# Patient Record
Sex: Female | Born: 1960 | Race: White | Hispanic: No | Marital: Married | Smoking: Never smoker
Health system: Southern US, Community
[De-identification: ages and names within clinical notes are randomized; demographics above are authoritative.]

## PROBLEM LIST (undated history)

## (undated) DIAGNOSIS — N390 Urinary tract infection, site not specified: Secondary | ICD-10-CM

## (undated) DIAGNOSIS — D649 Anemia, unspecified: Secondary | ICD-10-CM

## (undated) DIAGNOSIS — E785 Hyperlipidemia, unspecified: Secondary | ICD-10-CM

## (undated) DIAGNOSIS — M810 Age-related osteoporosis without current pathological fracture: Secondary | ICD-10-CM

## (undated) DIAGNOSIS — M1990 Unspecified osteoarthritis, unspecified site: Secondary | ICD-10-CM

## (undated) DIAGNOSIS — E7800 Pure hypercholesterolemia, unspecified: Secondary | ICD-10-CM

## (undated) DIAGNOSIS — N189 Chronic kidney disease, unspecified: Secondary | ICD-10-CM

## (undated) HISTORY — DX: Age-related osteoporosis without current pathological fracture: M81.0

## (undated) HISTORY — DX: Pure hypercholesterolemia, unspecified: E78.00

---

## 1996-06-22 HISTORY — PX: KNEE SURGERY: SHX244

## 2007-01-20 ENCOUNTER — Encounter: Admission: RE | Admit: 2007-01-20 | Discharge: 2007-01-20 | Payer: Self-pay | Admitting: *Deleted

## 2007-01-28 ENCOUNTER — Encounter: Admission: RE | Admit: 2007-01-28 | Discharge: 2007-01-28 | Payer: Self-pay | Admitting: *Deleted

## 2008-02-01 ENCOUNTER — Encounter: Admission: RE | Admit: 2008-02-01 | Discharge: 2008-02-01 | Payer: Self-pay | Admitting: *Deleted

## 2008-05-03 IMAGING — MG MM DIAGNOSTIC LTD BILATERAL
4 series · 4 of 4 positions shown · non-contrast
Comparison: Screening mammogram 01-20-07.

[REDACTED] BILATERAL
Bilateral CC and MLO view(s) were taken.

BILATERAL BREAST ULTRASOUND
DIGITAL LIMITED BILATERAL DIAGNOSTIC MAMMOGRAM AND BILATERAL BREAST ULTRASOUND:
CLINICAL DATA: Abnormal screening mammogram.

[R CC]
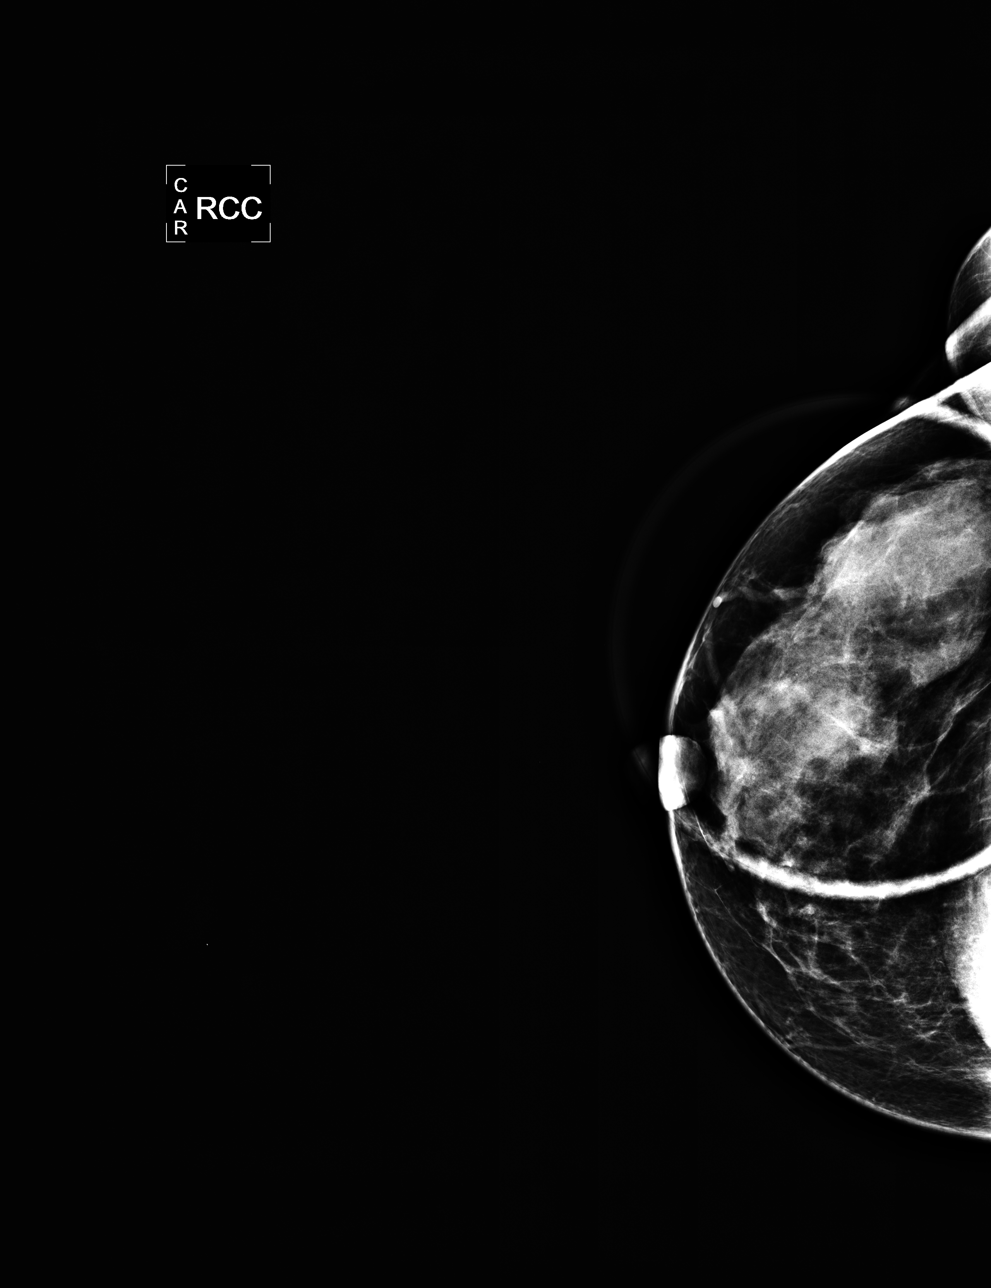

[R MLO]
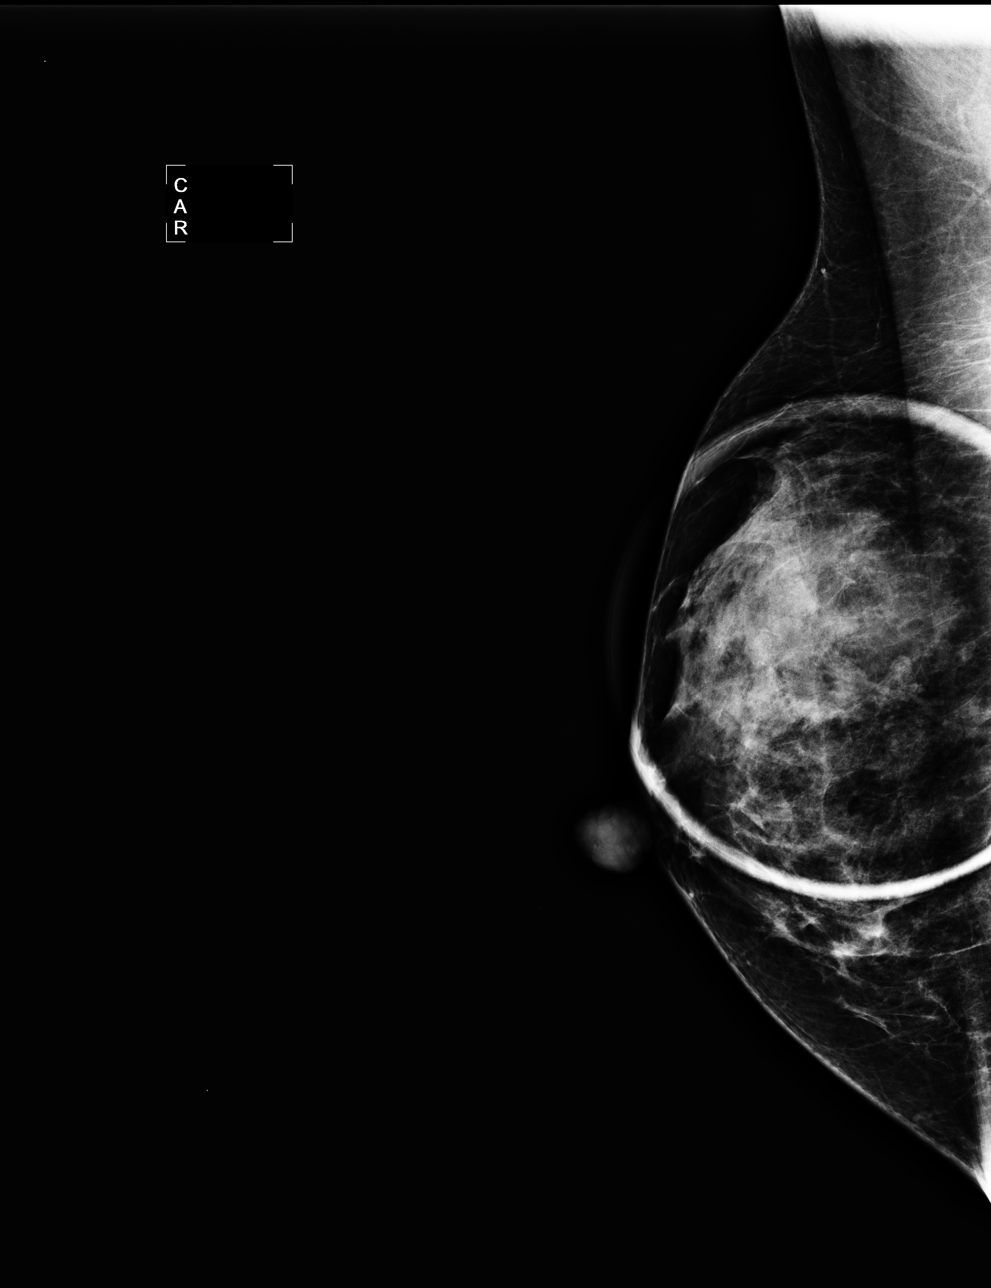

[L CC]
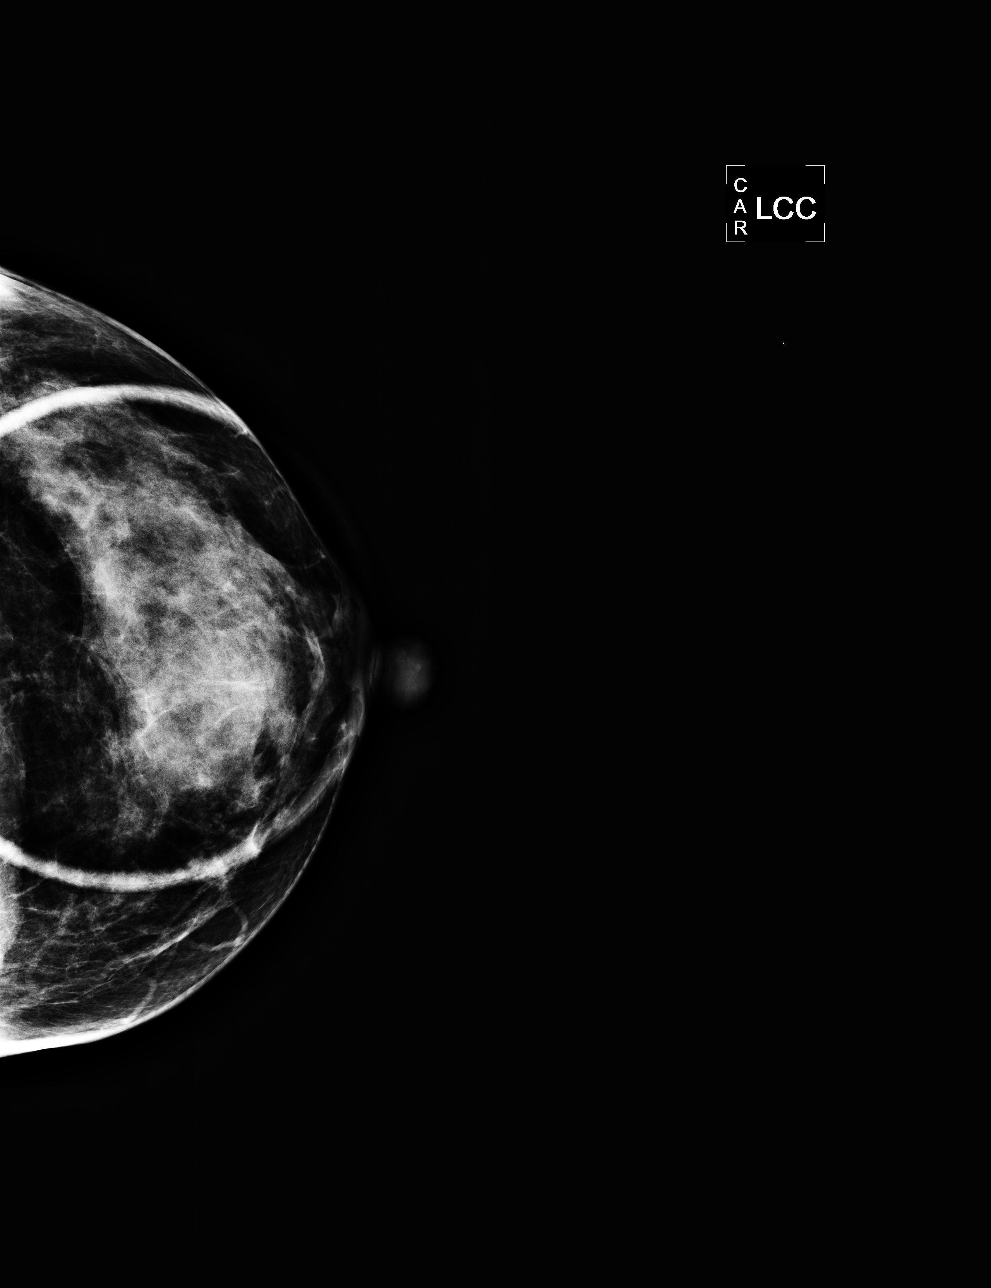

[L MLO]
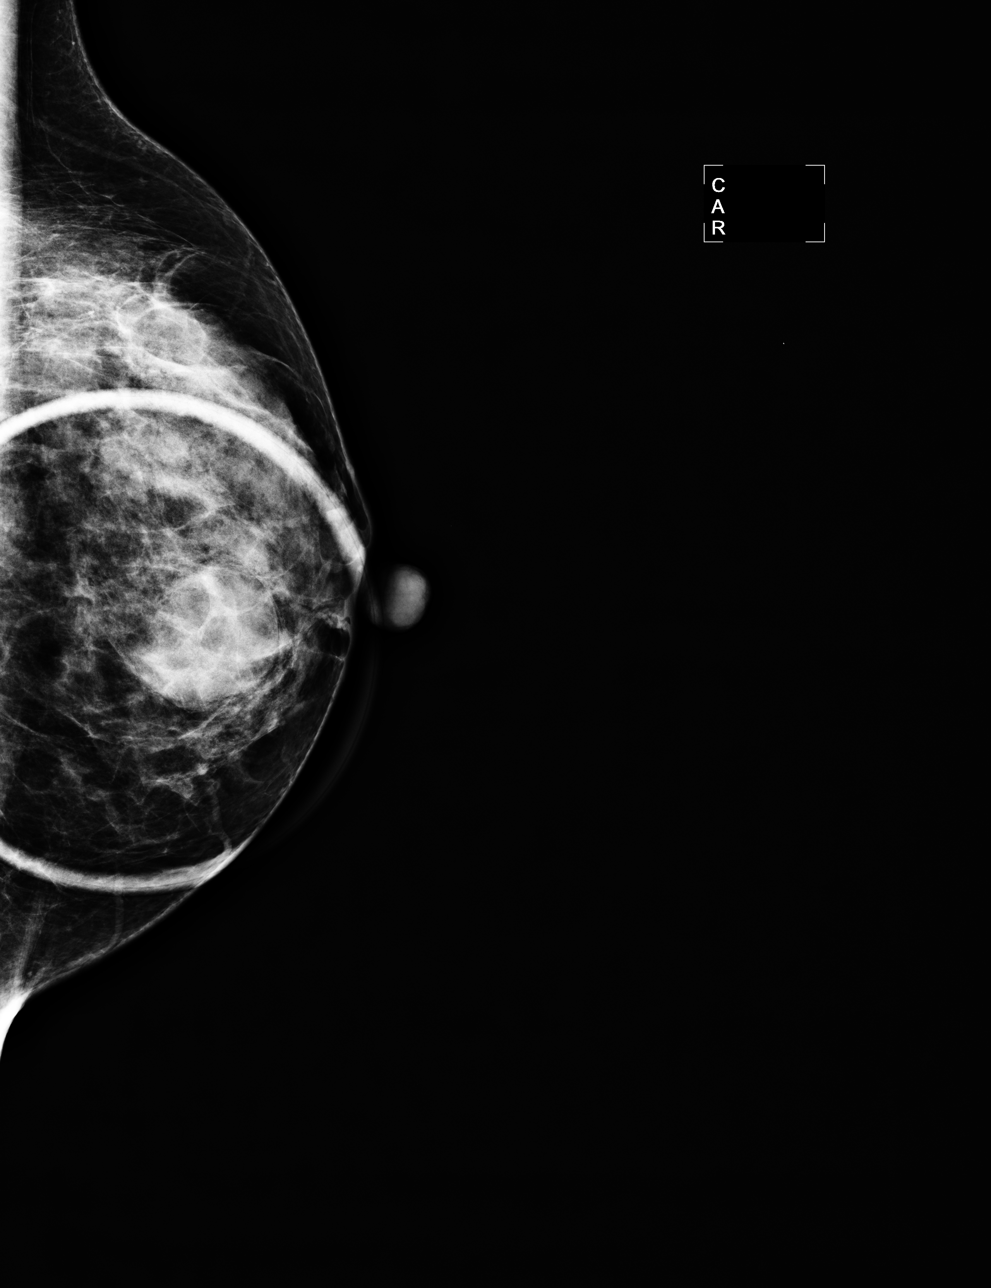

[4 of 4 positions shown; findings below may reference images not displayed]

No other priors for comparison.

Spot compression views of both breasts were performed to evaluate possible masses seen on recent 
screening mammogram.  It is noted that the patient's previous mammograms were not available for 
review and were performed in [place].

Spot compression views of the left breast confirm a round mass within dense fibroglandular breast 
tissue just slightly inferior to the nipple at the 6 o'clock position.

Spot compression views of the right breast show dense fibroglandular breast tissue without discrete
mass or architectural distortion.

Physical examination of the 6 o'clock region of the left breast is unremarkable.  Physical 
examination of the upper outer quadrant of the right breast is also unremarkable.  No dominant mass
can be palpated on physical exam.

Ultrasound of the left breast shows a simple cyst at 6 o'clock position subareolar location.  This 
measures 2.6 x 1.4 x 2.2 cm.  Additionally, in the surrounding tissue a few additional cysts are 
identified. No solid mass is seen.

Ultrasound of the upper outer quadrant of the right breast shows a large simple cyst at 10 o'clock 
2 cm from the nipple that measures 3.8 x 1.2 cm.  Immediately adjacent to the large cyst in the 
right breast at [DATE] o'clockis a 0.4 x 0.4  cm round hypoechoic nodule that most likely represents 
a cyst; however, it does contain some internal echoes and its small size makes characterization 
somewhat difficult.
IMPRESSION: Bilateral cysts as described above. There is one cyst in the right breast at [DATE] position that 
most likely represents a cyst although it is difficult to exclude a solid nodule and, therefore, it
is decided to proceed with ultrasound-guided cyst aspiration.  This will be dictated separately.

ASSESSMENT: Probably benign - BI-RADS 3

Aspiration of the right breast.
,

## 2008-05-03 IMAGING — US UNKNOWN US STUDY
1 series · 13 of 14 positions shown · non-contrast
Comparison: Screening mammogram 01-20-07.

[REDACTED] BILATERAL
Bilateral CC and MLO view(s) were taken.

BILATERAL BREAST ULTRASOUND
DIGITAL LIMITED BILATERAL DIAGNOSTIC MAMMOGRAM AND BILATERAL BREAST ULTRASOUND:
CLINICAL DATA: Abnormal screening mammogram.

[Series 1: unknown us study · 13 of 14 slices shown]
[im 1/14]
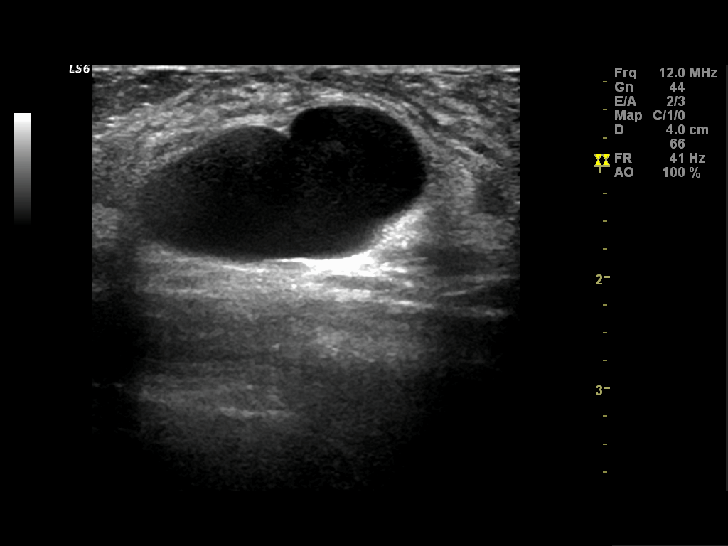
[im 2/14]
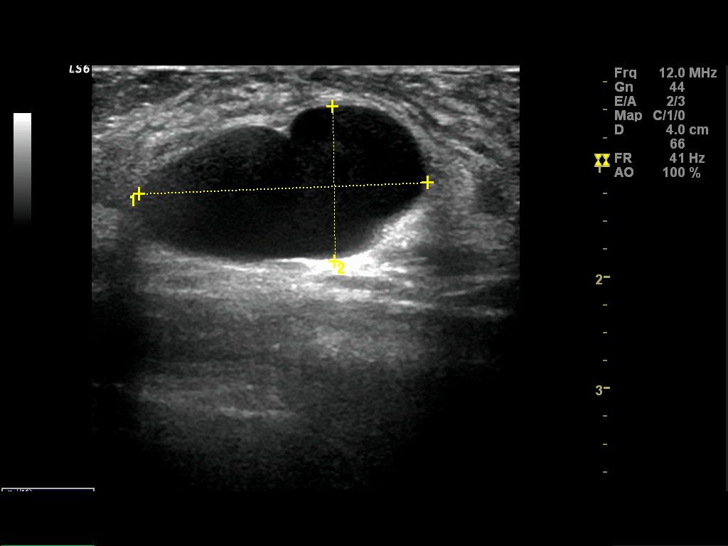
[im 3/14]
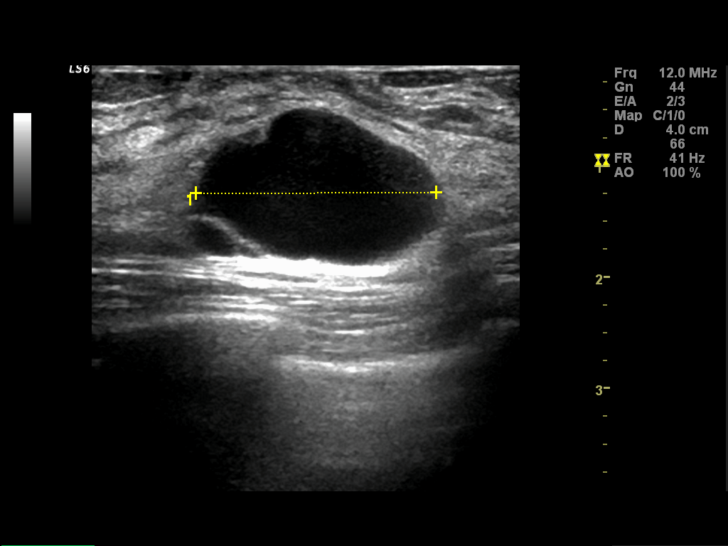
[im 4/14]
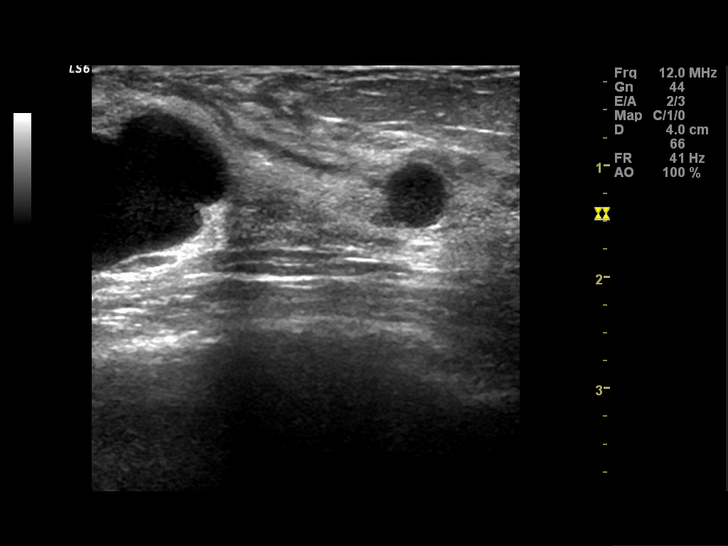
[im 5/14]
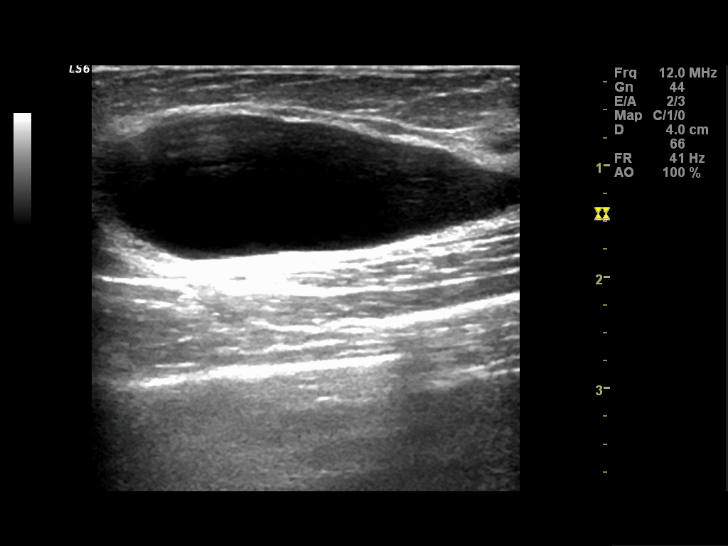
[im 6/14]
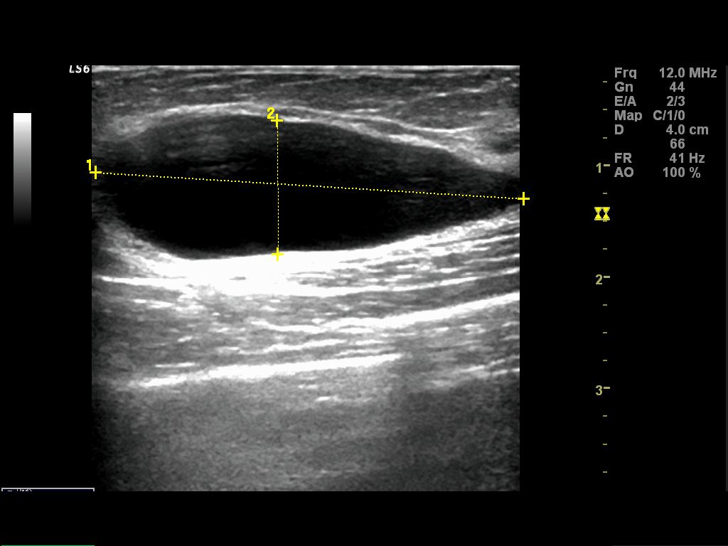
[im 8/14]
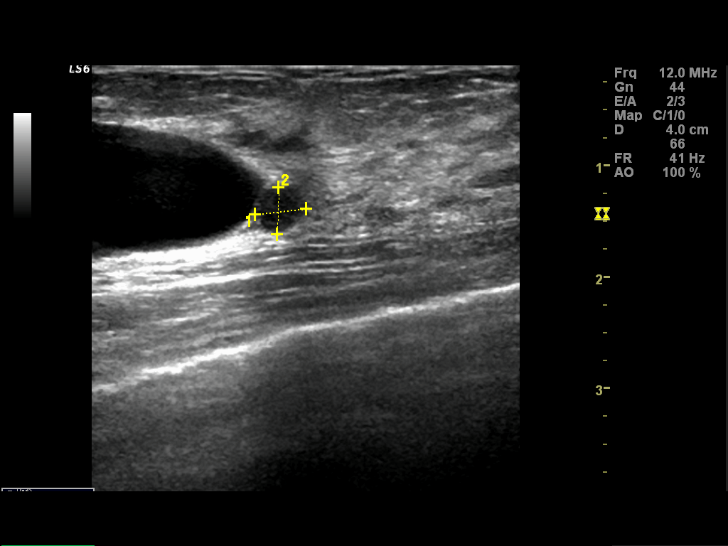
[im 9/14]
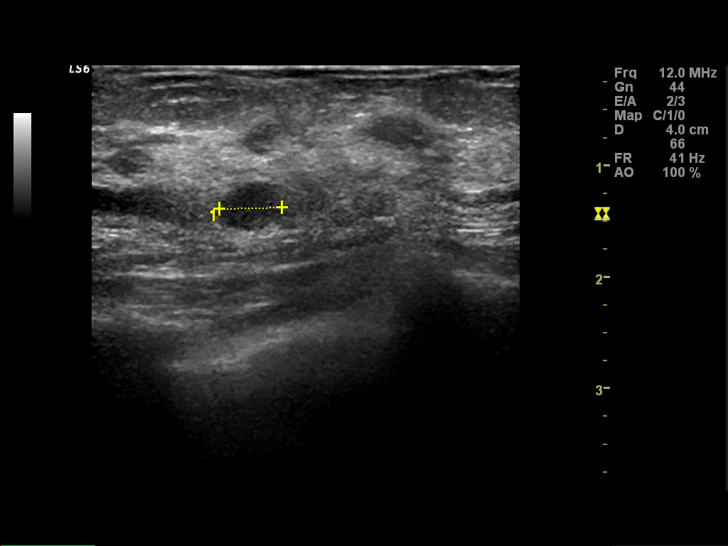
[im 10/14]
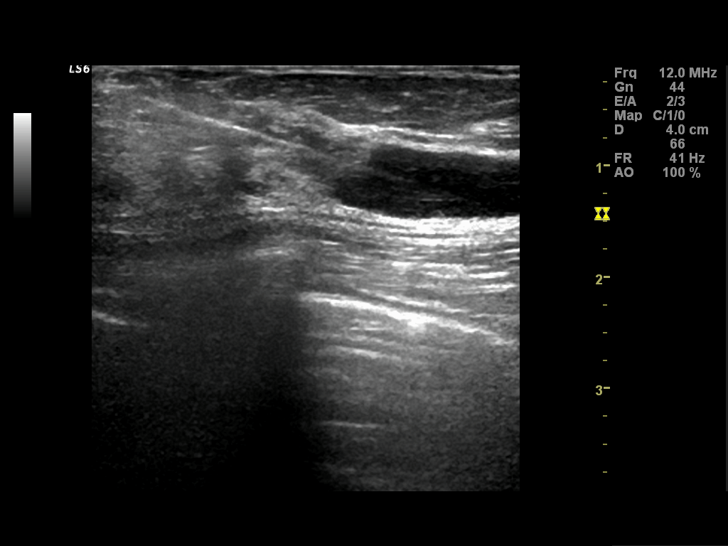
[im 11/14]
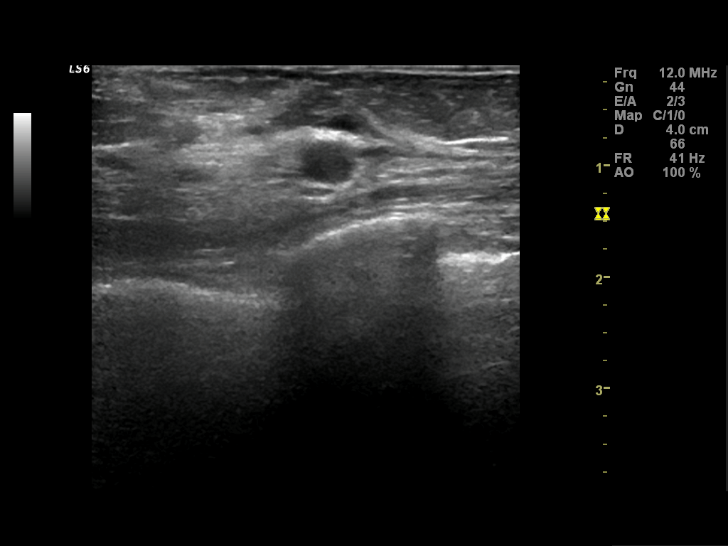
[im 12/14]
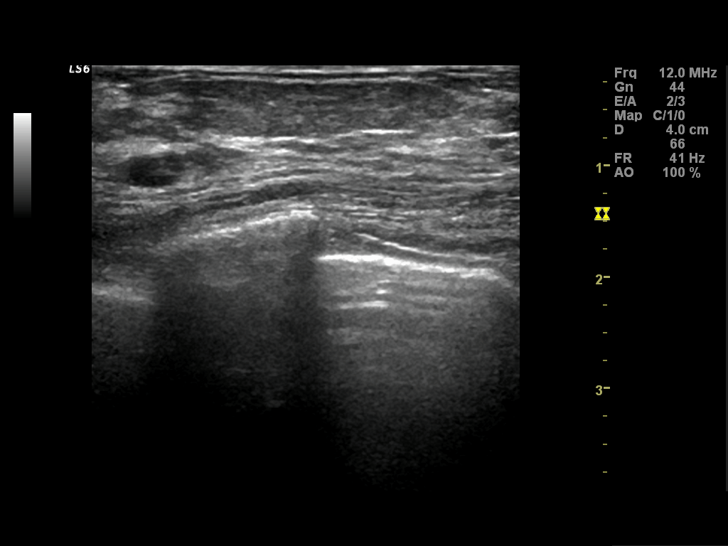
[im 13/14]
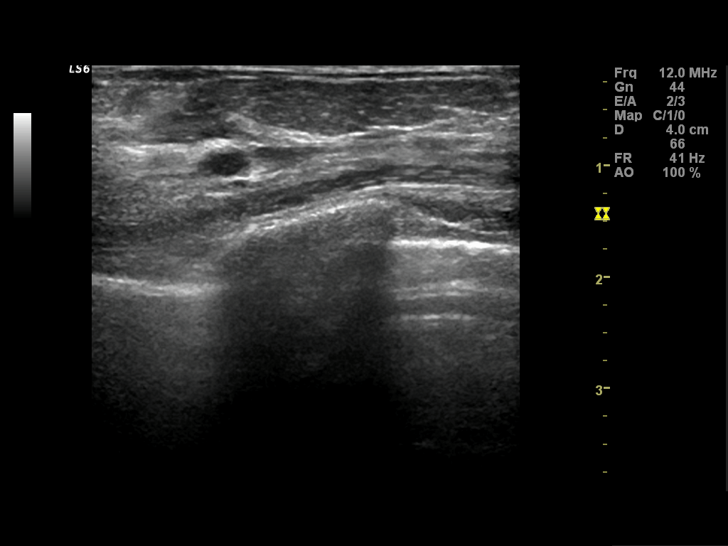
[im 14/14]
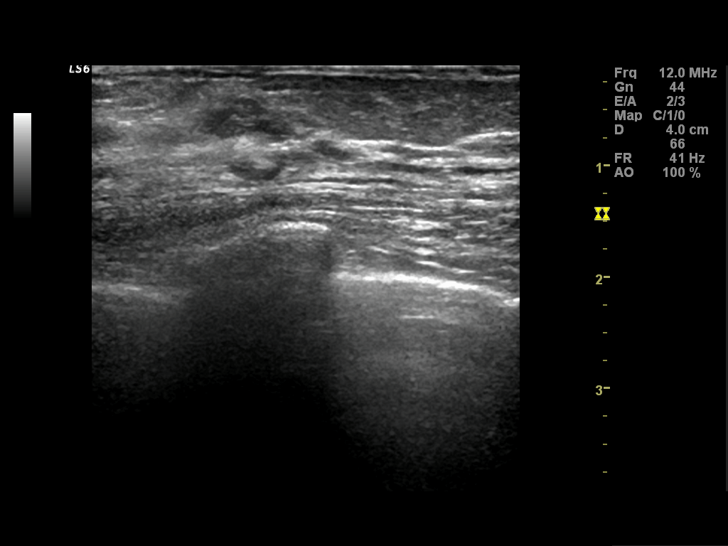

[13 of 14 positions shown; findings below may reference images not displayed]

No other priors for comparison.

Spot compression views of both breasts were performed to evaluate possible masses seen on recent 
screening mammogram.  It is noted that the patient's previous mammograms were not available for 
review and were performed in [place].

Spot compression views of the left breast confirm a round mass within dense fibroglandular breast 
tissue just slightly inferior to the nipple at the 6 o'clock position.

Spot compression views of the right breast show dense fibroglandular breast tissue without discrete
mass or architectural distortion.

Physical examination of the 6 o'clock region of the left breast is unremarkable.  Physical 
examination of the upper outer quadrant of the right breast is also unremarkable.  No dominant mass
can be palpated on physical exam.

Ultrasound of the left breast shows a simple cyst at 6 o'clock position subareolar location.  This 
measures 2.6 x 1.4 x 2.2 cm.  Additionally, in the surrounding tissue a few additional cysts are 
identified. No solid mass is seen.

Ultrasound of the upper outer quadrant of the right breast shows a large simple cyst at 10 o'clock 
2 cm from the nipple that measures 3.8 x 1.2 cm.  Immediately adjacent to the large cyst in the 
right breast at [DATE] o'clockis a 0.4 x 0.4  cm round hypoechoic nodule that most likely represents 
a cyst; however, it does contain some internal echoes and its small size makes characterization 
somewhat difficult.
IMPRESSION: Bilateral cysts as described above. There is one cyst in the right breast at [DATE] position that 
most likely represents a cyst although it is difficult to exclude a solid nodule and, therefore, it
is decided to proceed with ultrasound-guided cyst aspiration.  This will be dictated separately.

ASSESSMENT: Probably benign - BI-RADS 3

Aspiration of the right breast.
,

## 2009-02-01 ENCOUNTER — Encounter: Admission: RE | Admit: 2009-02-01 | Discharge: 2009-02-01 | Payer: Self-pay | Admitting: Obstetrics and Gynecology

## 2010-03-14 ENCOUNTER — Encounter: Admission: RE | Admit: 2010-03-14 | Discharge: 2010-03-14 | Payer: Self-pay | Admitting: Obstetrics and Gynecology

## 2010-07-14 ENCOUNTER — Encounter: Payer: Self-pay | Admitting: *Deleted

## 2011-03-05 ENCOUNTER — Other Ambulatory Visit: Payer: Self-pay | Admitting: Obstetrics and Gynecology

## 2011-03-05 DIAGNOSIS — Z1231 Encounter for screening mammogram for malignant neoplasm of breast: Secondary | ICD-10-CM

## 2011-03-19 ENCOUNTER — Ambulatory Visit
Admission: RE | Admit: 2011-03-19 | Discharge: 2011-03-19 | Disposition: A | Discharge status: Home / Self Care | Payer: 59 | Source: Ambulatory Visit | Attending: Obstetrics and Gynecology | Admitting: Obstetrics and Gynecology

## 2011-03-19 DIAGNOSIS — Z1231 Encounter for screening mammogram for malignant neoplasm of breast: Secondary | ICD-10-CM

## 2011-03-26 ENCOUNTER — Other Ambulatory Visit: Payer: Self-pay | Admitting: Obstetrics and Gynecology

## 2011-03-26 DIAGNOSIS — R928 Other abnormal and inconclusive findings on diagnostic imaging of breast: Secondary | ICD-10-CM

## 2011-04-08 ENCOUNTER — Ambulatory Visit
Admission: RE | Admit: 2011-04-08 | Discharge: 2011-04-08 | Disposition: A | Discharge status: Home / Self Care | Payer: 59 | Source: Ambulatory Visit | Attending: Obstetrics and Gynecology | Admitting: Obstetrics and Gynecology

## 2011-04-08 DIAGNOSIS — R928 Other abnormal and inconclusive findings on diagnostic imaging of breast: Secondary | ICD-10-CM

## 2012-03-04 ENCOUNTER — Encounter (INDEPENDENT_AMBULATORY_CARE_PROVIDER_SITE_OTHER): Payer: Self-pay | Admitting: General Surgery

## 2012-03-07 ENCOUNTER — Ambulatory Visit (INDEPENDENT_AMBULATORY_CARE_PROVIDER_SITE_OTHER): Payer: Self-pay | Admitting: General Surgery

## 2012-03-16 ENCOUNTER — Ambulatory Visit (INDEPENDENT_AMBULATORY_CARE_PROVIDER_SITE_OTHER): Payer: Self-pay | Admitting: General Surgery

## 2012-04-07 ENCOUNTER — Ambulatory Visit (INDEPENDENT_AMBULATORY_CARE_PROVIDER_SITE_OTHER): Payer: Self-pay | Admitting: General Surgery

## 2012-04-20 ENCOUNTER — Ambulatory Visit (INDEPENDENT_AMBULATORY_CARE_PROVIDER_SITE_OTHER): Payer: Self-pay | Admitting: General Surgery

## 2012-04-26 ENCOUNTER — Encounter (INDEPENDENT_AMBULATORY_CARE_PROVIDER_SITE_OTHER): Payer: Self-pay | Admitting: General Surgery

## 2012-04-26 ENCOUNTER — Ambulatory Visit (INDEPENDENT_AMBULATORY_CARE_PROVIDER_SITE_OTHER): Payer: 59 | Admitting: General Surgery

## 2012-04-26 VITALS — BP 110/62 | HR 68 | Temp 97.0°F | Resp 16 | Ht 64.0 in | Wt 132.0 lb

## 2012-04-26 DIAGNOSIS — K4090 Unilateral inguinal hernia, without obstruction or gangrene, not specified as recurrent: Secondary | ICD-10-CM

## 2012-04-26 NOTE — Progress Notes (Signed)
Patient ID: Tammy Meyers, female   DOB: April 20, 1961, 51 y.o.   MRN: [id number]  Chief Complaint  Patient presents with  . Pre-op Exam    eval possible ING hernia    HPI Tammy Meyers is a 51 y.o. female.   HPI 51 yo WF referred by Arlana Lindau, NP, for evaluation of left groin pain. The patient states that she has noticed a [place] at lump in her left groin on and off for about a month now. She describes it as a size of a golf ball. It is more noticeable after she has been standing for prolonged period of time or when she exercises. She states it will cause her discomfort when it pops out as well as when it goes back in. Sometimes it is larger in size than other times. She denies any nausea, vomiting, diarrhea, constipation, melena, hematochezia, bloating. She reports normal appetite. She denies any dysuria. She denies any prior abdominal surgery.  History reviewed. No pertinent past medical history.  Past Surgical History  Procedure Date  . Knee surgery 1998    left    History reviewed. No pertinent family history.  Social History History  Substance Use Topics  . Smoking status: Never Smoker   . Smokeless tobacco: Not on file  . Alcohol Use: Yes     Comment: occasional    No Known Allergies  Current Outpatient Prescriptions  Medication Sig Dispense Refill  . CALCIUM PO Take by mouth.      . Multiple Vitamin (MULTIVITAMIN) tablet Take 1 tablet by mouth daily.      . Norethindrone Acetate-Ethinyl Estrad-FE (LOESTRIN 24 FE) 1-20 MG-MCG(24) tablet Take 1 tablet by mouth daily.        Review of Systems Review of Systems  Constitutional: Negative for fever, activity change, appetite change, fatigue and unexpected weight change.  HENT: Negative for nosebleeds, neck pain and neck stiffness.   Eyes: Negative for photophobia and visual disturbance.  Respiratory: Negative for cough, chest tightness and shortness of breath.   Cardiovascular: Negative for chest pain,  palpitations and leg swelling.       Denies SOB, DOE, orthopnea, PND  Gastrointestinal:       See hpi  Genitourinary: Positive for menstrual problem (irregular menses; on OCP). Negative for dysuria, hematuria and difficulty urinating.  Musculoskeletal: Negative.   Neurological: Negative.   Hematological: Negative.   Psychiatric/Behavioral: Negative.     Blood pressure 110/62, pulse 68, temperature 97 F (36.1 C), temperature source Temporal, resp. rate 16, height 5\' 4"  (1.626 m), weight 132 lb (59.875 kg), last menstrual period 04/22/2012.  Physical Exam Physical Exam  Vitals reviewed. Constitutional: She is oriented to person, place, and time. She appears well-developed and well-nourished. No distress.  HENT:  Head: Normocephalic and atraumatic.  Right Ear: External ear normal.  Eyes: Conjunctivae normal are normal. No scleral icterus.  Neck: Normal range of motion. Neck supple. No tracheal deviation present. No thyromegaly present.  Cardiovascular: Normal rate, regular rhythm and normal heart sounds.   Pulmonary/Chest: Effort normal and breath sounds normal. No respiratory distress. She has no wheezes. She has no rales.  Abdominal: Soft. Bowel sounds are normal. She exhibits no distension. There is no tenderness. There is no rebound and no guarding. A hernia is present. Hernia confirmed positive in the left inguinal area. Hernia confirmed negative in the right inguinal area.         Hernia only noticeable with standing and with valsalva; reducible; small hernia  Musculoskeletal: She  exhibits no edema and no tenderness.  Neurological: She is alert and oriented to person, place, and time. She exhibits normal muscle tone.  Skin: Skin is warm and dry. No rash noted. She is not diaphoretic. No erythema.  Psychiatric: She has a normal mood and affect. Her behavior is normal. Judgment and thought content normal.    Data Reviewed Arlana Lindau NP note from 03/03/12  Assessment    Left  inguinal hernia    Plan    We discussed the etiology of inguinal hernias. We discussed the signs & symptoms of incarceration & strangulation.  We discussed non-operative and operative management. We discussed both open and laparoscopic hernia repairs and the pros and cons of each.  The patient has elected to proceed with a LAPAROSCOPIC REPAIR OF LEFT INGUINAL HERNIA WITH MESH   I described the procedure in detail.  The patient was given educational material. We discussed the risks and benefits including but not limited to bleeding, infection, chronic inguinal pain, nerve entrapment, hernia recurrence, mesh complications, hematoma formation, urinary retention, injury to the testicles or the ovaries, numbness in the groin, blood clots, injury to the surrounding structures, and anesthesia risk. We also discussed the typical post operative recovery course, including no heavy lifting for 4-6 weeks. I explained that the likelihood of improvement of their symptoms is good  [name]. Andrey Campanile, MD, FACS General, Bariatric, & Minimally Invasive Surgery [facility], [place]         [facility] M 04/26/2012, 4:48 PM

## 2012-04-26 NOTE — Patient Instructions (Signed)
Hernia Repair with Laparoscope A hernia occurs when an internal organ pushes out through a weak spot in the belly (abdominal) wall muscles. Hernias most commonly occur in the groin and around the navel. Hernias can also occur through a cut by the surgeon (incision) after an abdominal operation. A hernia may be caused by:  Lifting heavy objects.  Prolonged coughing.  Straining to move your bowels. Hernias can often be pushed back into place (reduced). Most hernias tend to get worse over time. Problems occur when abdominal contents get stuck in the opening and the blood supply is blocked or impaired (incarcerated hernia). Because of these risks, you require surgery to repair the hernia. Your hernia will be repaired using a laparoscope. Laparoscopic surgery is a type of minimally invasive surgery. It does not involve making a typical surgical cut (incision) in the skin. A laparoscope is a telescope-like rod and lens system. It is usually connected to a video camera and a light source so your caregiver can clearly see the operative area. The instruments are inserted through  to  inch (5 mm or 10 mm) openings in the skin at specific locations. A working and viewing space is created by blowing a small amount of carbon dioxide gas into the abdominal cavity. The abdomen is essentially blown up like a balloon (insufflated). This elevates the abdominal wall above the internal organs like a dome. The carbon dioxide gas is common to the human body and can be absorbed by tissue and removed by the respiratory system. Once the repair is completed, the small incisions will be closed with either stitches (sutures) or staples (just like a paper stapler only this staple holds the skin together). LET YOUR CAREGIVERS KNOW ABOUT:  Allergies.  Medications taken including herbs, eye drops, over the counter medications, and creams.  Use of steroids (by mouth or creams).  Previous problems with anesthetics or  Novocaine.  Possibility of pregnancy, if this applies.  History of blood clots (thrombophlebitis).  History of bleeding or blood problems.  Previous surgery.  Other health problems. BEFORE THE PROCEDURE  Laparoscopy can be done either in a hospital or out-patient clinic. You may be given a mild sedative to help you relax before the procedure. Once in the operating room, you will be given a general anesthesia to make you sleep (unless you and your caregiver choose a different anesthetic).  AFTER THE PROCEDURE  After the procedure you will be watched in a recovery area. Depending on what type of hernia was repaired, you might be admitted to the hospital or you might go home the same day. With this procedure you may have less pain and scarring. This usually results in a quicker recovery and less risk of infection. HOME CARE INSTRUCTIONS   Bed rest is not required. You may continue your normal activities but avoid heavy lifting (more than 10 pounds) or straining.  Cough gently. If you are a smoker it is best to stop, as even the best hernia repair can break down with the continual strain of coughing.  Avoid driving until given the OK by your surgeon.  There are no dietary restrictions unless given otherwise.  TAKE ALL MEDICATIONS AS DIRECTED.  Only take over-the-counter or prescription medicines for pain, discomfort, or fever as directed by your caregiver. SEEK MEDICAL CARE IF:   There is increasing abdominal pain or pain in your incisions.  There is more bleeding from incisions, other than minimal spotting.  You feel light headed or faint.  You   develop an unexplained fever, chills, and/or an oral temperature above 102 F (38.9 C).  You have redness, swelling, or increasing pain in the wound.  Pus coming from wound.  A foul smell coming from the wound or dressings. SEEK IMMEDIATE MEDICAL CARE IF:   You develop a rash.  You have difficulty breathing.  You have any  allergic problems. MAKE SURE YOU:   Understand these instructions.  Will watch your condition.  Will get help right away if you are not doing well or get worse. Document Released: 06/08/2005 Document Revised: 08/31/2011 Document Reviewed: 05/08/2009 [employer] Patient Information 2013 [employer].  

## 2012-05-28 ENCOUNTER — Emergency Department (HOSPITAL_BASED_OUTPATIENT_CLINIC_OR_DEPARTMENT_OTHER)
Admission: EM | Admit: 2012-05-28 | Discharge: 2012-05-28 | Disposition: A | Discharge status: Home / Self Care | Payer: 59 | Attending: Emergency Medicine | Admitting: Emergency Medicine

## 2012-05-28 DIAGNOSIS — Z79899 Other long term (current) drug therapy: Secondary | ICD-10-CM | POA: Insufficient documentation

## 2012-05-28 DIAGNOSIS — R112 Nausea with vomiting, unspecified: Secondary | ICD-10-CM | POA: Insufficient documentation

## 2012-05-28 DIAGNOSIS — N10 Acute tubulo-interstitial nephritis: Secondary | ICD-10-CM

## 2012-05-28 DIAGNOSIS — R300 Dysuria: Secondary | ICD-10-CM | POA: Insufficient documentation

## 2012-05-28 LAB — URINE MICROSCOPIC-ADD ON

## 2012-05-28 LAB — CBC WITH DIFFERENTIAL/PLATELET
Basophils Absolute: 0 10*3/uL (ref 0.0–0.1)
Basophils Relative: 0 % (ref 0–1)
Eosinophils Absolute: 0.1 10*3/uL (ref 0.0–0.7)
Eosinophils Relative: 1 % (ref 0–5)
HCT: 34.1 % — ABNORMAL LOW (ref 36.0–46.0)
MCH: 30.2 pg (ref 26.0–34.0)
MCHC: 34.3 g/dL (ref 30.0–36.0)
MCV: 87.9 fL (ref 78.0–100.0)
Monocytes Absolute: 1.1 10*3/uL — ABNORMAL HIGH (ref 0.1–1.0)
RDW: 12.9 % (ref 11.5–15.5)

## 2012-05-28 LAB — COMPREHENSIVE METABOLIC PANEL
AST: 12 U/L (ref 0–37)
Albumin: 3.3 g/dL — ABNORMAL LOW (ref 3.5–5.2)
BUN: 9 mg/dL (ref 6–23)
Calcium: 8.8 mg/dL (ref 8.4–10.5)
Creatinine, Ser: 0.8 mg/dL (ref 0.50–1.10)

## 2012-05-28 LAB — URINALYSIS, ROUTINE W REFLEX MICROSCOPIC
Glucose, UA: NEGATIVE mg/dL
Specific Gravity, Urine: 1.014 (ref 1.005–1.030)
Urobilinogen, UA: 1 mg/dL (ref 0.0–1.0)

## 2012-05-28 MED ORDER — DEXTROSE 5 % IV SOLN
1.0000 g | Freq: Once | INTRAVENOUS | Status: AC
  Administered 2012-05-28: 1 g via INTRAVENOUS
  Filled 2012-05-28: qty 10

## 2012-05-28 MED ORDER — MORPHINE SULFATE 4 MG/ML IJ SOLN
4.0000 mg | Freq: Once | INTRAMUSCULAR | Status: AC
  Administered 2012-05-28: 4 mg via INTRAVENOUS
  Filled 2012-05-28: qty 1

## 2012-05-28 MED ORDER — ONDANSETRON HCL 4 MG/2ML IJ SOLN
4.0000 mg | Freq: Once | INTRAMUSCULAR | Status: AC
  Administered 2012-05-28: 4 mg via INTRAVENOUS
  Filled 2012-05-28: qty 2

## 2012-05-28 MED ORDER — SODIUM CHLORIDE 0.9 % IV BOLUS (SEPSIS)
1000.0000 mL | Freq: Once | INTRAVENOUS | Status: AC
  Administered 2012-05-28: 1000 mL via INTRAVENOUS

## 2012-05-28 MED ORDER — ONDANSETRON HCL 4 MG PO TABS: 4.0000 mg | ORAL_TABLET | Freq: Four times a day (QID) | ORAL | Status: DC

## 2012-05-28 MED ORDER — CEPHALEXIN 500 MG PO CAPS: 500.0000 mg | ORAL_CAPSULE | Freq: Four times a day (QID) | ORAL | Status: DC

## 2012-05-28 NOTE — ED Provider Notes (Addendum)
History     CSN: [id number]  Arrival date & time 05/28/12  0158   First MD Initiated Contact with Patient 05/28/12 0211      Chief Complaint  Patient presents with  . Abdominal Pain    (Consider location/radiation/quality/duration/timing/severity/associated sxs/prior treatment) Patient is a 51 y.o. female presenting with abdominal pain. The history is provided by the patient.  Abdominal Pain The primary symptoms of the illness include abdominal pain, nausea, vomiting and dysuria. The primary symptoms of the illness do not include fever, diarrhea, vaginal discharge or vaginal bleeding. Episode onset: 3 days ago. The onset of the illness was gradual. The problem has been gradually worsening.  The pain came on gradually. The abdominal pain is located in the RLQ and suprapubic region. The abdominal pain radiates to the right flank. The severity of the abdominal pain is 8/10. The abdominal pain is relieved by nothing. The abdominal pain is exacerbated by urination and eating.  The vomiting began yesterday. Vomiting occurs 6 to 10 times per day. The emesis contains stomach contents.  The dysuria began yesterday. The discomfort is moderate. She is not currently sexually active. The dysuria is associated with frequency and urgency. The dysuria is not associated with discharge or vaginal pain.  The patient states that she believes she is currently not pregnant. The patient has not had a change in bowel habit. Additional symptoms associated with the illness include chills, anorexia, urgency, frequency and back pain.    No past medical history on file.  Past Surgical History  Procedure Date  . Knee surgery 1998    left    No family history on file.  History  Substance Use Topics  . Smoking status: Never Smoker   . Smokeless tobacco: Not on file  . Alcohol Use: Yes     Comment: occasional    OB History    Grav Para Term Preterm Abortions TAB SAB Ect Mult Living                   Review of Systems  Constitutional: Positive for chills. Negative for fever.  Gastrointestinal: Positive for nausea, vomiting, abdominal pain and anorexia. Negative for diarrhea.  Genitourinary: Positive for dysuria, urgency and frequency. Negative for vaginal bleeding, vaginal discharge and vaginal pain.  Musculoskeletal: Positive for back pain.  All other systems reviewed and are negative.    Allergies  Review of patient's allergies indicates no known allergies.  Home Medications   Current Outpatient Rx  Name  Route  Sig  Dispense  Refill  . CALCIUM PO   Oral   Take by mouth.         . ONE-DAILY MULTI VITAMINS PO TABS   Oral   Take 1 tablet by mouth daily.         Azzie Roup ACE-ETH ESTRAD-FE 1-20 MG-MCG(24) PO TABS   Oral   Take 1 tablet by mouth daily.           BP 145/79  Pulse 72  Temp 98.2 F (36.8 C) (Oral)  Resp 14  SpO2 100%  Physical Exam  Nursing note and vitals reviewed. Constitutional: She is oriented to person, place, and time. She appears well-developed and well-nourished. No distress.  HENT:  Head: Normocephalic and atraumatic.  Mouth/Throat: Oropharynx is clear and moist. Mucous membranes are dry.  Eyes: Conjunctivae normal and EOM are normal. Pupils are equal, round, and reactive to light.  Neck: Normal range of motion. Neck supple.  Cardiovascular: Normal rate, regular  rhythm and intact distal pulses.   No murmur heard. Pulmonary/Chest: Effort normal and breath sounds normal. No respiratory distress. She has no wheezes. She has no rales.  Abdominal: Soft. She exhibits no distension. There is tenderness in the right lower quadrant and suprapubic area. There is CVA tenderness. There is no rebound and no guarding.  Musculoskeletal: Normal range of motion. She exhibits no edema and no tenderness.  Neurological: She is alert and oriented to person, place, and time.  Skin: Skin is warm and dry. No rash noted. No erythema.  Psychiatric: She  has a normal mood and affect. Her behavior is normal.    ED Course  Procedures (including critical care time)  Labs Reviewed  CBC WITH DIFFERENTIAL - Abnormal; Notable for the following:    Hemoglobin 11.7 (*)     HCT 34.1 (*)     Monocytes Absolute 1.1 (*)     All other components within normal limits  COMPREHENSIVE METABOLIC PANEL - Abnormal; Notable for the following:    Glucose, Bld 112 (*)     Albumin 3.3 (*)     Alkaline Phosphatase 21 (*)     GFR calc non Af Amer 83 (*)     All other components within normal limits  URINALYSIS, ROUTINE W REFLEX MICROSCOPIC - Abnormal; Notable for the following:    APPearance TURBID (*)     Hgb urine dipstick LARGE (*)     Ketones, ur 15 (*)     Protein, ur 100 (*)     Nitrite POSITIVE (*)     Leukocytes, UA MODERATE (*)     All other components within normal limits  URINE MICROSCOPIC-ADD ON - Abnormal; Notable for the following:    Squamous Epithelial / LPF FEW (*)     Bacteria, UA MANY (*)     All other components within normal limits  URINE CULTURE   No results found.   1. Pyelonephritis, acute       MDM   Patient with symptoms most consistent with pyelonephritis. She states she developed right-sided flank and abdominal pain 3 days ago and has just worsened and now she is having vomiting and chills. Denies diarrhea is also complaining of dysuria. No prior history of kidney stones and patient's history is less consistent with kidney stone.  No findings on exam concerning for appendicitis at this time, cholecystitis or pancreatitis. Patient does appear to be dehydrated and has not been able to keep anything down for greater than 24 hours. CBC, CMP, UA pending. Patient given IV fluids pain and nausea medication.  3:41 AM CBC, CMP wnl.  UA consistent with UTI.  Pt has pyelo and given rocephin.  Feels much better after pain and nausea meds.  Passed po challenge.  Will d/c home with keflex.      Gwyneth Sprout, MD 05/28/12  [id number]  Gwyneth Sprout, MD 05/28/12 9811  Gwyneth Sprout, MD 05/28/12 [phone]

## 2012-05-28 NOTE — ED Notes (Signed)
MD at bedside. 

## 2012-05-28 NOTE — ED Notes (Signed)
Pt verbalizes understanding 

## 2012-05-28 NOTE — ED Notes (Signed)
Pt reports left side hernia.  Pt while have hernia sx in a week

## 2012-05-28 NOTE — ED Notes (Signed)
Pt present with c/o "bad cold on monday"  Pt reports traveling in [place], [place].  Pt made it back 9:30 last night.  Pt c/o abd/back pain since last night.  Pt reports n/v.  Pt reports chills and sweats.

## 2012-05-30 ENCOUNTER — Encounter (HOSPITAL_COMMUNITY): Payer: Self-pay | Admitting: Pharmacy Technician

## 2012-05-30 LAB — URINE CULTURE: Colony Count: >=100000

## 2012-05-31 ENCOUNTER — Encounter (HOSPITAL_COMMUNITY): Payer: Self-pay

## 2012-05-31 ENCOUNTER — Encounter (HOSPITAL_COMMUNITY)
Admission: RE | Admit: 2012-05-31 | Discharge: 2012-05-31 | Disposition: A | Discharge status: Home / Self Care | Payer: 59 | Source: Ambulatory Visit | Attending: General Surgery | Admitting: General Surgery

## 2012-05-31 HISTORY — DX: Urinary tract infection, site not specified: N39.0

## 2012-05-31 HISTORY — DX: Anemia, unspecified: D64.9

## 2012-05-31 LAB — CBC WITH DIFFERENTIAL/PLATELET
Basophils Absolute: 0 10*3/uL (ref 0.0–0.1)
Basophils Relative: 1 % (ref 0–1)
Eosinophils Absolute: 0.2 10*3/uL (ref 0.0–0.7)
Hemoglobin: 11.8 g/dL — ABNORMAL LOW (ref 12.0–15.0)
MCH: 29.3 pg (ref 26.0–34.0)
MCHC: 33.2 g/dL (ref 30.0–36.0)
Monocytes Relative: 9 % (ref 3–12)
Neutro Abs: 3.2 10*3/uL (ref 1.7–7.7)
Neutrophils Relative %: 50 % (ref 43–77)
Platelets: 303 10*3/uL (ref 150–400)

## 2012-05-31 LAB — SURGICAL PCR SCREEN
MRSA, PCR: NEGATIVE
Staphylococcus aureus: NEGATIVE

## 2012-05-31 LAB — HCG, SERUM, QUALITATIVE: Preg, Serum: NEGATIVE

## 2012-05-31 MED ORDER — CHLORHEXIDINE GLUCONATE 4 % EX LIQD: 1.0000 "application " | Freq: Once | CUTANEOUS | Status: DC

## 2012-05-31 NOTE — ED Notes (Signed)
+   Urine Patient treated with Keflex-sensitive to same-chart appended per protocol MD. 

## 2012-05-31 NOTE — Pre-Procedure Instructions (Signed)
20 Tammy Meyers  05/31/2012   Your procedure is scheduled on:  Tuesday June 07, 2012  Report to [facility] at 9:30 AM.  Call this number if you have problems the morning of surgery: [phone]   Remember:   Do not eat food or drink After Midnight.    Take these medicines the morning of surgery with A SIP OF WATER: keflex, loestrin, DISCONTINUE ASPIRIN, COUMADIN, PLAVIX, EFFIENT, AND HERBAL MEDICATIONS (7 DAYS PRIOR TO SURGERY)   Do not wear jewelry, make-up or nail polish.  Do not wear lotions, powders, or perfumes.   Do not shave 48 hours prior to surgery.  Do not bring valuables to the hospital.  Contacts, dentures or bridgework may not be worn into surgery.  Leave suitcase in the car. After surgery it may be brought to your room.  For patients admitted to the hospital, checkout time is 11:00 AM the day of discharge.   Patients discharged the day of surgery will not be allowed to drive home.  Name and phone number of your driver: FAMILY / FRIEND  Special Instructions: Shower using CHG 2 nights before surgery and the night before surgery.  If you shower the day of surgery use CHG.  Use special wash - you have one bottle of CHG for all showers.  You should use approximately 1/3 of the bottle for each shower.   Please read over the following fact sheets that you were given: Pain Booklet, Coughing and Deep Breathing, MRSA Information and Surgical Site Infection Prevention

## 2012-06-06 MED ORDER — CEFAZOLIN SODIUM-DEXTROSE 2-3 GM-% IV SOLR
2.0000 g | INTRAVENOUS | Status: AC
  Administered 2012-06-07: 2 g via INTRAVENOUS
  Filled 2012-06-06: qty 50

## 2012-06-07 ENCOUNTER — Encounter (HOSPITAL_COMMUNITY): Payer: Self-pay | Admitting: Anesthesiology

## 2012-06-07 ENCOUNTER — Encounter (HOSPITAL_COMMUNITY)
Admission: RE | Disposition: A | Discharge status: Home / Self Care | Payer: Self-pay | Source: Ambulatory Visit | Attending: General Surgery

## 2012-06-07 ENCOUNTER — Ambulatory Visit (HOSPITAL_COMMUNITY): Payer: 59 | Admitting: Anesthesiology

## 2012-06-07 ENCOUNTER — Ambulatory Visit (HOSPITAL_COMMUNITY)
Admission: RE | Admit: 2012-06-07 | Discharge: 2012-06-07 | Disposition: A | Discharge status: Home / Self Care | Payer: 59 | Source: Ambulatory Visit | Attending: General Surgery | Admitting: General Surgery

## 2012-06-07 ENCOUNTER — Encounter (HOSPITAL_COMMUNITY): Payer: Self-pay | Admitting: *Deleted

## 2012-06-07 DIAGNOSIS — Z01812 Encounter for preprocedural laboratory examination: Secondary | ICD-10-CM | POA: Insufficient documentation

## 2012-06-07 DIAGNOSIS — K4090 Unilateral inguinal hernia, without obstruction or gangrene, not specified as recurrent: Secondary | ICD-10-CM | POA: Insufficient documentation

## 2012-06-07 HISTORY — PX: INSERTION OF MESH: SHX5868

## 2012-06-07 HISTORY — PX: INGUINAL HERNIA REPAIR: SHX194

## 2012-06-07 SURGERY — REPAIR, HERNIA, INGUINAL, LAPAROSCOPIC
Anesthesia: General | Site: Groin | Laterality: Left | Wound class: Clean

## 2012-06-07 MED ORDER — NEOSTIGMINE METHYLSULFATE 1 MG/ML IJ SOLN
INTRAMUSCULAR | Status: DC | PRN
  Administered 2012-06-07: 4 mg via INTRAVENOUS

## 2012-06-07 MED ORDER — GLYCOPYRROLATE 0.2 MG/ML IJ SOLN
INTRAMUSCULAR | Status: DC | PRN
  Administered 2012-06-07: .5 mg via INTRAVENOUS

## 2012-06-07 MED ORDER — ACETAMINOPHEN 10 MG/ML IV SOLN
INTRAVENOUS | Status: AC
  Filled 2012-06-07: qty 100

## 2012-06-07 MED ORDER — ARTIFICIAL TEARS OP OINT
TOPICAL_OINTMENT | OPHTHALMIC | Status: DC | PRN
  Administered 2012-06-07: 1 via OPHTHALMIC

## 2012-06-07 MED ORDER — ONDANSETRON HCL 4 MG/2ML IJ SOLN
INTRAMUSCULAR | Status: DC | PRN
  Administered 2012-06-07: 4 mg via INTRAVENOUS

## 2012-06-07 MED ORDER — DEXAMETHASONE SODIUM PHOSPHATE 4 MG/ML IJ SOLN
INTRAMUSCULAR | Status: DC | PRN
  Administered 2012-06-07: 4 mg via INTRAVENOUS

## 2012-06-07 MED ORDER — HYDROMORPHONE HCL PF 1 MG/ML IJ SOLN: 0.2500 mg | INTRAMUSCULAR | Status: DC | PRN

## 2012-06-07 MED ORDER — 0.9 % SODIUM CHLORIDE (POUR BTL) OPTIME
TOPICAL | Status: DC | PRN
  Administered 2012-06-07: 1000 mL

## 2012-06-07 MED ORDER — PROPOFOL 10 MG/ML IV BOLUS
INTRAVENOUS | Status: DC | PRN
  Administered 2012-06-07: 150 mg via INTRAVENOUS

## 2012-06-07 MED ORDER — MIDAZOLAM HCL 5 MG/5ML IJ SOLN
INTRAMUSCULAR | Status: DC | PRN
  Administered 2012-06-07: 2 mg via INTRAVENOUS

## 2012-06-07 MED ORDER — OXYCODONE-ACETAMINOPHEN 5-325 MG PO TABS: 1.0000 | ORAL_TABLET | ORAL | Status: DC | PRN

## 2012-06-07 MED ORDER — ROCURONIUM BROMIDE 100 MG/10ML IV SOLN
INTRAVENOUS | Status: DC | PRN
Start: 2012-06-07 — End: 2012-06-07
  Administered 2012-06-07: 40 mg via INTRAVENOUS

## 2012-06-07 MED ORDER — LACTATED RINGERS IV SOLN
INTRAVENOUS | Status: DC | PRN
  Administered 2012-06-07 (×2): via INTRAVENOUS

## 2012-06-07 MED ORDER — OXYCODONE HCL 5 MG/5ML PO SOLN: 5.0000 mg | Freq: Once | ORAL | Status: DC | PRN

## 2012-06-07 MED ORDER — OXYCODONE HCL 5 MG PO TABS: 5.0000 mg | ORAL_TABLET | Freq: Once | ORAL | Status: DC | PRN

## 2012-06-07 MED ORDER — METOCLOPRAMIDE HCL 5 MG/ML IJ SOLN: 10.0000 mg | Freq: Once | INTRAMUSCULAR | Status: DC | PRN

## 2012-06-07 MED ORDER — FENTANYL CITRATE 0.05 MG/ML IJ SOLN
INTRAMUSCULAR | Status: DC | PRN
  Administered 2012-06-07: 50 ug via INTRAVENOUS
  Administered 2012-06-07 (×2): 100 ug via INTRAVENOUS

## 2012-06-07 MED ORDER — BUPIVACAINE-EPINEPHRINE 0.25% -1:200000 IJ SOLN
INTRAMUSCULAR | Status: DC | PRN
  Administered 2012-06-07: 27 mL

## 2012-06-07 MED ORDER — LIDOCAINE HCL (CARDIAC) 20 MG/ML IV SOLN
INTRAVENOUS | Status: DC | PRN
  Administered 2012-06-07: 60 mg via INTRAVENOUS

## 2012-06-07 MED ORDER — ACETAMINOPHEN 10 MG/ML IV SOLN
1000.0000 mg | Freq: Once | INTRAVENOUS | Status: AC
  Administered 2012-06-07: 1000 mg via INTRAVENOUS
  Filled 2012-06-07: qty 100

## 2012-06-07 SURGICAL SUPPLY — 47 items
APPLICATOR COTTON TIP 6IN STRL (MISCELLANEOUS) IMPLANT
APPLIER CLIP 5 13 M/L LIGAMAX5 (MISCELLANEOUS)
APPLIER CLIP ROT 10 11.4 M/L (STAPLE)
BENZOIN TINCTURE PRP APPL 2/3 (GAUZE/BANDAGES/DRESSINGS) IMPLANT
BLADE SURG ROTATE 9660 (MISCELLANEOUS) ×3 IMPLANT
CANISTER SUCTION 2500CC (MISCELLANEOUS) IMPLANT
CHLORAPREP W/TINT 26ML (MISCELLANEOUS) ×3 IMPLANT
CLIP APPLIE 5 13 M/L LIGAMAX5 (MISCELLANEOUS) IMPLANT
CLIP APPLIE ROT 10 11.4 M/L (STAPLE) IMPLANT
CLOTH BEACON ORANGE TIMEOUT ST (SAFETY) ×3 IMPLANT
COVER SURGICAL LIGHT HANDLE (MISCELLANEOUS) ×3 IMPLANT
DECANTER SPIKE VIAL GLASS SM (MISCELLANEOUS) ×3 IMPLANT
DERMABOND ADVANCED (GAUZE/BANDAGES/DRESSINGS) ×1
DERMABOND ADVANCED .7 DNX12 (GAUZE/BANDAGES/DRESSINGS) ×2 IMPLANT
DEVICE SECURE STRAP 25 ABSORB (INSTRUMENTS) ×3 IMPLANT
DRAPE INCISE IOBAN 66X45 STRL (DRAPES) IMPLANT
DRAPE UTILITY 15X26 W/TAPE STR (DRAPE) ×6 IMPLANT
DRSG TEGADERM 4X4.75 (GAUZE/BANDAGES/DRESSINGS) IMPLANT
ELECT REM PT RETURN 9FT ADLT (ELECTROSURGICAL) ×3
ELECTRODE REM PT RTRN 9FT ADLT (ELECTROSURGICAL) ×2 IMPLANT
GAUZE SPONGE 2X2 8PLY STRL LF (GAUZE/BANDAGES/DRESSINGS) IMPLANT
GLOVE BIOGEL M STRL SZ7.5 (GLOVE) ×3 IMPLANT
GLOVE BIOGEL PI IND STRL 7.0 (GLOVE) ×4 IMPLANT
GLOVE BIOGEL PI IND STRL 7.5 (GLOVE) ×2 IMPLANT
GLOVE BIOGEL PI IND STRL 8 (GLOVE) ×2 IMPLANT
GLOVE BIOGEL PI INDICATOR 7.0 (GLOVE) ×2
GLOVE BIOGEL PI INDICATOR 7.5 (GLOVE) ×1
GLOVE BIOGEL PI INDICATOR 8 (GLOVE) ×1
GLOVE SURG SS PI 7.0 STRL IVOR (GLOVE) ×3 IMPLANT
GOWN PREVENTION PLUS XXLARGE (GOWN DISPOSABLE) ×3 IMPLANT
GOWN STRL NON-REIN LRG LVL3 (GOWN DISPOSABLE) ×6 IMPLANT
KIT BASIN OR (CUSTOM PROCEDURE TRAY) ×3 IMPLANT
KIT ROOM TURNOVER OR (KITS) ×3 IMPLANT
MESH ULTRAPRO 3X6 7.6X15CM (Mesh General) ×3 IMPLANT
NS IRRIG 1000ML POUR BTL (IV SOLUTION) ×3 IMPLANT
PAD ARMBOARD 7.5X6 YLW CONV (MISCELLANEOUS) ×6 IMPLANT
SCISSORS LAP 5X35 DISP (ENDOMECHANICALS) IMPLANT
SET IRRIG TUBING LAPAROSCOPIC (IRRIGATION / IRRIGATOR) IMPLANT
SPONGE GAUZE 2X2 STER 10/PKG (GAUZE/BANDAGES/DRESSINGS)
SUT MNCRL AB 4-0 PS2 18 (SUTURE) ×3 IMPLANT
TOWEL OR 17X24 6PK STRL BLUE (TOWEL DISPOSABLE) ×3 IMPLANT
TOWEL OR 17X26 10 PK STRL BLUE (TOWEL DISPOSABLE) ×3 IMPLANT
TRAY FOLEY CATH 14FR (SET/KITS/TRAYS/PACK) ×3 IMPLANT
TRAY LAPAROSCOPIC (CUSTOM PROCEDURE TRAY) ×3 IMPLANT
TROCAR XCEL BLADELESS 5X75MML (TROCAR) ×6 IMPLANT
TROCAR XCEL BLUNT TIP 100MML (ENDOMECHANICALS) ×3 IMPLANT
WATER STERILE IRR 1000ML POUR (IV SOLUTION) IMPLANT

## 2012-06-07 NOTE — Op Note (Signed)
06/07/2012  Tammy Meyers [birth year]   PREOPERATIVE DIAGNOSIS: left inguinal hernia.   POSTOPERATIVE DIAGNOSIS: left direct inguinal hernia.   PROCEDURE: Laparoscopic repair of left direct inguinal hernia with  mesh (TAPP).   SURGEON: [name]. Andrey Campanile, MD   ASSISTANT SURGEON: None.   ANESTHESIA: General plus local consisting of 0.25% Marcaine with epi.   ESTIMATED BLOOD LOSS: Minimal.   FINDINGS: The patient had a left direct inguinal hernia.  It was repaired using a 3 inch x 6  inch piece of Ethicon UltraPro mesh.   SPECIMEN: none  INDICATIONS FOR PROCEDURE: 51 yo WF with symptomatic left groin bulge c/w an inguinal hernia desiring repair. The risks and benefits including but not limited to bleeding, infection, chronic inguinal pain, nerve entrapment, hernia recurrence, mesh complications, hematoma formation, urinary retention, injury to the ovaries, numbness in the groin, blood clots, injury to the surrounding structures, and anesthesia risk was discussed with the patient.  DESCRIPTION OF PROCEDURE: After obtaining verbal consent and marking  the left groin in the holding area with the patient confirming the  operative site, the patient was then taken back to the operating room, placed  supine on the operating room table. General endotracheal anesthesia was  established. The patient had emptied their bladder prior to going back to  the operating room. Sequential compression devices were placed. The  abdomen and groin were prepped and draped in the usual standard surgical  fashion with ChloraPrep. The patient received IV Tylenol as well as IV  antibiotics prior to the incision. A surgical time-out was performed.  Local was infiltrated at the base of the umbilicus.   Next, a 1-cm curvilinear infraumbilical incision was made with a #11 blade. The fascia  was grasped and lifted anteriorly. Next, the fascia was incised, and  the abdominal cavity was entered. Pursestring  suture was placed around  the fascial edges using a 0 Vicryl. A 12-mm Hasson trocar was placed.  Pneumoperitoneum was smoothly established up to a patient pressure of 15  mmHg. Laparoscope was advanced. There was no evidence of a  contralateral hernia. The patient had a defect medial to  the inferior epigastric vessel, consistent with an left direct  hernia. Two 5-mm trocars were placed, one on the right, one on the left  in the midclavicular line slightly above the level of the umbilicus all  under direct visualization. After local had been infiltrated, I then  made incision along the peritoneum on the left, starting 2 inches above  the anterior superior iliac spine and caring it medial  toward the median umbilical ligament in a lazy S configuration using  Endo Shears with electrocautery. The peritoneal flap was then gently  dissected downward from the anterior abdominal wall taking care not to  injure the inferior epigastric vessels. The pubic bone was identified.  The round ligament was identified.  Using  traction and counter traction with short graspers, I reduced the direct hernia in  its entirety. The round ligament had been identified and preserved. I then went about creating a large pocket by  lifting the peritoneum of the pelvic floor. I took great care not to  injure the iliac vessels.   Local anesthetic was injected 2 finger breadths below and medial to the anterior superior iliac spine as well as along the left groin prior to placing the mesh. I then obtained a piece of Ethicon UltraPro mesh 3 inch x  6 inch, placed it through the Hasson trocar, half of it covered  medial  to the inferior epigastric vessels and half of it lateral to the  inferior epigastric vessels. The defect was well  covered with the mesh. I then secured the mesh to the abdominal wall  using an Ethicon secure strap tack. Tacks were placed through  the Cooper's ligament, one medially, one tack on each side of  the inferior epigastric  vessel and two tacks out laterally. No tacks were placed below the  shelving edge of the inguinal ligament. Pneumoperitoneum was reduced  to 8 mmHg. I then brought the peritoneal flap back up to the abdominal  wall and tacked it to the abdominal wall using 4 tacks. There was no  defect in the peritoneum, and the mesh was well covered. I removed the  Hasson trocar and tied down the previously placed pursestring suture.  The closure was viewed laparoscopically. There was no evidence of  fascial defect. There was no air leak at the umbilicus. There was no  evidence of injury to surrounding structures. Pneumoperitoneum was  released, and the remaining trocars were removed. All skin incisions  were closed with a 4-0 Monocryl in a subcuticular fashion followed by  application of Dermabond. All needle, instrument, and sponge counts  were correct x2. There are no immediate complications. The patient  tolerated the procedure well. The patient was extubated and taken to the  recovery room in stable condition.  [name]. Andrey Campanile, MD, FACS General, Bariatric, & Minimally Invasive Surgery [facility], [place]

## 2012-06-07 NOTE — Anesthesia Procedure Notes (Signed)
Procedure Name: Intubation Date/Time: 06/07/2012 10:40 AM Performed by: Lovie Chol Pre-anesthesia Checklist: Patient identified, Emergency Drugs available, Suction available, Patient being monitored and Timeout performed Patient Re-evaluated:Patient Re-evaluated prior to inductionOxygen Delivery Method: Circle system utilized Preoxygenation: Pre-oxygenation with 100% oxygen Intubation Type: IV induction Ventilation: Mask ventilation without difficulty Laryngoscope Size: Miller and 2 Grade View: Grade I Tube type: Oral Tube size: 7.0 mm Number of attempts: 1 Airway Equipment and Method: Stylet Placement Confirmation: ETT inserted through vocal cords under direct vision,  positive ETCO2,  CO2 detector and breath sounds checked- equal and bilateral Secured at: 21 cm Tube secured with: Tape Dental Injury: Teeth and Oropharynx as per pre-operative assessment

## 2012-06-07 NOTE — Anesthesia Postprocedure Evaluation (Signed)
  Anesthesia Post-op Note  Patient: Tammy Meyers  Procedure(s) Performed: Procedure(s) (LRB) with comments: LAPAROSCOPIC INGUINAL HERNIA (Left) INSERTION OF MESH (Left)  Patient Location: PACU  Anesthesia Type:General  Level of Consciousness: awake  Airway and Oxygen Therapy: Patient Spontanous Breathing  Post-op Pain: mild  Post-op Assessment: Post-op Vital signs reviewed  Post-op Vital Signs: stable  Complications: No apparent anesthesia complications

## 2012-06-07 NOTE — Transfer of Care (Signed)
Immediate Anesthesia Transfer of Care Note  Patient: Tammy Meyers  Procedure(s) Performed: Procedure(s) (LRB) with comments: LAPAROSCOPIC INGUINAL HERNIA (Left) INSERTION OF MESH (Left)  Patient Location: PACU  Anesthesia Type:General  Level of Consciousness: awake, alert , oriented and patient cooperative  Airway & Oxygen Therapy: Patient Spontanous Breathing and Patient connected to nasal cannula oxygen  Post-op Assessment: Report given to PACU RN and Post -op Vital signs reviewed and stable  Post vital signs: Reviewed and stable  Complications: No apparent anesthesia complications

## 2012-06-07 NOTE — Preoperative (Signed)
Beta Blockers   Reason not to administer Beta Blockers:Not Applicable 

## 2012-06-07 NOTE — Anesthesia Preprocedure Evaluation (Addendum)
Anesthesia Evaluation  Patient identified by MRN, date of birth, ID band Patient awake    Reviewed: Allergy & Precautions, H&P , NPO status , Patient's Chart, lab work & pertinent test results  History of Anesthesia Complications Negative for: history of anesthetic complications  Airway Mallampati: II TM Distance: >3 FB Neck ROM: full    Dental   Pulmonary neg pulmonary ROS,  breath sounds clear to auscultation        Cardiovascular negative cardio ROS  Rhythm:regular Rate:Normal     Neuro/Psych negative neurological ROS  negative psych ROS   GI/Hepatic negative GI ROS, Neg liver ROS,   Endo/Other  negative endocrine ROS  Renal/GU negative Renal ROS  negative genitourinary   Musculoskeletal   Abdominal   Peds  Hematology  (+) Blood dyscrasia, anemia ,   Anesthesia Other Findings See surgeon's H&P   Reproductive/Obstetrics negative OB ROS                         Anesthesia Physical Anesthesia Plan  ASA: II  Anesthesia Plan: General   Post-op Pain Management:    Induction: Intravenous  Airway Management Planned: Oral ETT  Additional Equipment:   Intra-op Plan:   Post-operative Plan: Extubation in OR  Informed Consent: I have reviewed the patients History and Physical, chart, labs and discussed the procedure including the risks, benefits and alternatives for the proposed anesthesia with the patient or authorized representative who has indicated his/her understanding and acceptance.   Dental Advisory Given  Plan Discussed with: CRNA and Surgeon  Anesthesia Plan Comments:         Anesthesia Quick Evaluation

## 2012-06-07 NOTE — H&P (Signed)
Tammy Meyers is an 51 y.o. female.   Chief Complaint: here for surgery HPI: 51 yo WF referred by Arlana Lindau, NP, for evaluation of left groin pain. The patient states that she has noticed a tender lump at lump in her left groin on and off for about a month now. She describes it as a size of a golf ball. It is more noticeable after she has been standing for prolonged period of time or when she exercises. She states it will cause her discomfort when it pops out as well as when it goes back in. Sometimes it is larger in size than other times. She denies any nausea, vomiting, diarrhea, constipation, melena, hematochezia, bloating. She reports normal appetite. She denies any dysuria. She denies any prior abdominal surgery.   Had a recent episode of RLQ/flank pain, dysuria, n/v about 12 days ago. Went to ED in [place]. Dx with pyelonephritis. Given rocephin and 10 day of abx. All symptoms have resolved. No dysuria, f/v/n/c   Past Medical History  Diagnosis Date  . Urinary tract infection   . Anemia     hx of    Past Surgical History  Procedure Date  . Knee surgery 1998    left    History reviewed. No pertinent family history. Social History:  reports that she has never smoked. She does not have any smokeless tobacco history on file. She reports that she drinks alcohol. She reports that she does not use illicit drugs.  Allergies: No Known Allergies  Medications Prior to Admission  Medication Sig Dispense Refill  . cephALEXin (KEFLEX) 500 MG capsule Take 500 mg by mouth 4 (four) times daily. Started on 12-7      . Norethindrone Acetate-Ethinyl Estrad-FE (LOESTRIN 24 FE) 1-20 MG-MCG(24) tablet Take 1 tablet by mouth daily.        No results found for this or any previous visit (from the past 48 hour(s)). No results found.  Review of Systems  Constitutional: Negative for weight loss and diaphoresis.  HENT: Negative for hearing loss.   Eyes: Negative for blurred vision and double  vision.  Respiratory: Negative for shortness of breath.   Cardiovascular: Negative for chest pain, orthopnea, leg swelling and PND.  Genitourinary: Negative for dysuria, urgency and flank pain.  Neurological: Negative for focal weakness, seizures, loss of consciousness and weakness.  All other systems reviewed and are negative.    Blood pressure 123/80, pulse 68, temperature 98.1 F (36.7 C), temperature source Oral, resp. rate 18, last menstrual period 05/01/2012, SpO2 99.00%. Physical Exam  Vitals reviewed. Constitutional: She is oriented to person, place, and time. She appears well-developed and well-nourished. No distress.  HENT:  Head: Normocephalic and atraumatic.  Right Ear: External ear normal.  Left Ear: External ear normal.  Eyes: Conjunctivae normal are normal. No scleral icterus.  Neck: Neck supple. No tracheal deviation present. No thyromegaly present.  Cardiovascular: Normal rate and intact distal pulses.   Respiratory: Effort normal. No respiratory distress.  GI: Soft. She exhibits no distension. There is no tenderness.  Musculoskeletal: She exhibits no edema.  Neurological: She is alert and oriented to person, place, and time.  Skin: Skin is warm and dry. No rash noted. She is not diaphoretic. No erythema.  Psychiatric: She has a normal mood and affect. Her behavior is normal. Judgment and thought content normal.     Assessment/Plan Left inguinal hernia To OR for lap repair - all questions asked and answered.   Pt recd 10 day  course of abx and is asymptomatic - believe it is safe to proceed with hernia repair  Tammy Meyers. Andrey Campanile, MD, FACS General, Bariatric, & Minimally Invasive Surgery [facility], [place]   [facility] M 06/07/2012, 10:21 AM

## 2012-06-09 ENCOUNTER — Encounter (HOSPITAL_COMMUNITY): Payer: Self-pay | Admitting: General Surgery

## 2012-06-23 ENCOUNTER — Ambulatory Visit (INDEPENDENT_AMBULATORY_CARE_PROVIDER_SITE_OTHER): Payer: 59 | Admitting: General Surgery

## 2012-06-23 ENCOUNTER — Encounter (INDEPENDENT_AMBULATORY_CARE_PROVIDER_SITE_OTHER): Payer: Self-pay | Admitting: General Surgery

## 2012-06-23 VITALS — BP 118/80 | HR 72 | Temp 97.0°F | Resp 14 | Ht 64.0 in | Wt 134.0 lb

## 2012-06-23 DIAGNOSIS — Z09 Encounter for follow-up examination after completed treatment for conditions other than malignant neoplasm: Secondary | ICD-10-CM

## 2012-06-23 NOTE — Patient Instructions (Signed)
Can do light cardio  Still do not lift, push, or pull anything greater than 15 pounds for another 4 weeks

## 2012-06-23 NOTE — Progress Notes (Signed)
Subjective:     Patient ID: Tammy Meyers, female   DOB: 20-Oct-1960, 52 y.o.   MRN: [id number]  HPI 52 year old Caucasian female comes in for followup after undergoing laparoscopic repair of a left inguinal hernia. She states that she did well after surgery. She returned back to work today. She denies any fevers, chills, nausea vomiting, or abdominal pain. She denies any dysuria. She denies any burning or stinging or numbness or tingling in her left groin. She reports a good appetite.  Review of Systems     Objective:   Physical Exam BP 118/80  Pulse 72  Temp 97 F (36.1 C) (Temporal)  Resp 14  Ht 5\' 4"  (1.626 m)  Wt 134 lb (60.782 kg)  BMI 23.00 kg/m2  LMP 04/26/2012  Gen: alert, NAD, non-toxic appearing Pupils: equal, no scleral icterus Abd: soft, nontender, nondistended. Well-healed trocar sites. No cellulitis. No incisional hernia. No LIH Ext: no edema, no calf tenderness Skin: no rash, no jaundice l   Assessment:     S/p lap repair of left direct inguinal hernia with mesh 06/07/12    Plan:     Doing well. I have released her to do light cardiac activity. She was reminded she should not do any strenuous activity such as lifting, pulling, or pushing anything greater than 15 pounds for another 4 weeks. Since she is doing so well I will let her followup as needed  [name]. Andrey Campanile, MD, FACS General, Bariatric, & Minimally Invasive Surgery [facility], [place]

## 2012-08-09 ENCOUNTER — Other Ambulatory Visit: Payer: Self-pay | Admitting: Obstetrics and Gynecology

## 2012-08-09 DIAGNOSIS — Z1231 Encounter for screening mammogram for malignant neoplasm of breast: Secondary | ICD-10-CM

## 2012-09-06 ENCOUNTER — Ambulatory Visit
Admission: RE | Admit: 2012-09-06 | Discharge: 2012-09-06 | Disposition: A | Discharge status: Home / Self Care | Payer: 59 | Source: Ambulatory Visit | Attending: Obstetrics and Gynecology | Admitting: Obstetrics and Gynecology

## 2012-09-06 DIAGNOSIS — Z1231 Encounter for screening mammogram for malignant neoplasm of breast: Secondary | ICD-10-CM

## 2013-04-27 ENCOUNTER — Other Ambulatory Visit: Payer: Self-pay

## 2013-08-25 ENCOUNTER — Emergency Department (HOSPITAL_BASED_OUTPATIENT_CLINIC_OR_DEPARTMENT_OTHER)
Admission: EM | Admit: 2013-08-25 | Discharge: 2013-08-25 | Disposition: A | Discharge status: Home / Self Care | Payer: 59 | Attending: Emergency Medicine | Admitting: Emergency Medicine

## 2013-08-25 ENCOUNTER — Encounter (HOSPITAL_BASED_OUTPATIENT_CLINIC_OR_DEPARTMENT_OTHER): Payer: Self-pay | Admitting: Emergency Medicine

## 2013-08-25 DIAGNOSIS — Z87440 Personal history of urinary (tract) infections: Secondary | ICD-10-CM | POA: Insufficient documentation

## 2013-08-25 DIAGNOSIS — Y9389 Activity, other specified: Secondary | ICD-10-CM | POA: Insufficient documentation

## 2013-08-25 DIAGNOSIS — S0100XA Unspecified open wound of scalp, initial encounter: Secondary | ICD-10-CM | POA: Insufficient documentation

## 2013-08-25 DIAGNOSIS — Y929 Unspecified place or not applicable: Secondary | ICD-10-CM | POA: Insufficient documentation

## 2013-08-25 DIAGNOSIS — Z79899 Other long term (current) drug therapy: Secondary | ICD-10-CM | POA: Insufficient documentation

## 2013-08-25 DIAGNOSIS — Z23 Encounter for immunization: Secondary | ICD-10-CM | POA: Insufficient documentation

## 2013-08-25 DIAGNOSIS — S0191XA Laceration without foreign body of unspecified part of head, initial encounter: Secondary | ICD-10-CM

## 2013-08-25 DIAGNOSIS — IMO0002 Reserved for concepts with insufficient information to code with codable children: Secondary | ICD-10-CM | POA: Insufficient documentation

## 2013-08-25 DIAGNOSIS — Z862 Personal history of diseases of the blood and blood-forming organs and certain disorders involving the immune mechanism: Secondary | ICD-10-CM | POA: Insufficient documentation

## 2013-08-25 MED ORDER — TETANUS-DIPHTH-ACELL PERTUSSIS 5-2.5-18.5 LF-MCG/0.5 IM SUSP
0.5000 mL | Freq: Once | INTRAMUSCULAR | Status: AC
  Administered 2013-08-25: 0.5 mL via INTRAMUSCULAR
  Filled 2013-08-25: qty 0.5

## 2013-08-25 MED ORDER — IBUPROFEN 800 MG PO TABS
800.0000 mg | ORAL_TABLET | Freq: Once | ORAL | Status: AC
  Administered 2013-08-25: 800 mg via ORAL
  Filled 2013-08-25: qty 1

## 2013-08-25 NOTE — Discharge Instructions (Signed)
Laceration Care, Adult °A laceration is a cut or lesion that goes through all layers of the skin and into the tissue just beneath the skin. °TREATMENT  °Some lacerations may not require closure. Some lacerations may not be able to be closed due to an increased risk of infection. It is important to see your caregiver as soon as possible after an injury to minimize the risk of infection and maximize the opportunity for successful closure. °If closure is appropriate, pain medicines may be given, if needed. The wound will be cleaned to help prevent infection. Your caregiver will use stitches (sutures), staples, wound glue (adhesive), or skin adhesive strips to repair the laceration. These tools bring the skin edges together to allow for faster healing and a better cosmetic outcome. However, all wounds will heal with a scar. Once the wound has healed, scarring can be minimized by covering the wound with sunscreen during the day for 1 full year. °HOME CARE INSTRUCTIONS  °For sutures or staples: °· Keep the wound clean and dry. °· If you were given a bandage (dressing), you should change it at least once a day. Also, change the dressing if it becomes wet or dirty, or as directed by your caregiver. °· Wash the wound with soap and water 2 times a day. Rinse the wound off with water to remove all soap. Pat the wound dry with a clean towel. °· After cleaning, apply a thin layer of the antibiotic ointment as recommended by your caregiver. This will help prevent infection and keep the dressing from sticking. °· You may shower as usual after the first 24 hours. Do not soak the wound in water until the sutures are removed. °· Only take over-the-counter or prescription medicines for pain, discomfort, or fever as directed by your caregiver. °· Get your sutures or staples removed as directed by your caregiver. °For skin adhesive strips: °· Keep the wound clean and dry. °· Do not get the skin adhesive strips wet. You may bathe  carefully, using caution to keep the wound dry. °· If the wound gets wet, pat it dry with a clean towel. °· Skin adhesive strips will fall off on their own. You may trim the strips as the wound heals. Do not remove skin adhesive strips that are still stuck to the wound. They will fall off in time. °For wound adhesive: °· You may briefly wet your wound in the shower or bath. Do not soak or scrub the wound. Do not swim. Avoid periods of heavy perspiration until the skin adhesive has fallen off on its own. After showering or bathing, gently pat the wound dry with a clean towel. °· Do not apply liquid medicine, cream medicine, or ointment medicine to your wound while the skin adhesive is in place. This may loosen the film before your wound is healed. °· If a dressing is placed over the wound, be careful not to apply tape directly over the skin adhesive. This may cause the adhesive to be pulled off before the wound is healed. °· Avoid prolonged exposure to sunlight or tanning lamps while the skin adhesive is in place. Exposure to ultraviolet light in the first year will darken the scar. °· The skin adhesive will usually remain in place for 5 to 10 days, then naturally fall off the skin. Do not pick at the adhesive film. °You may need a tetanus shot if: °· You cannot remember when you had your last tetanus shot. °· You have never had a tetanus   shot. If you get a tetanus shot, your arm may swell, get red, and feel warm to the touch. This is common and not a problem. If you need a tetanus shot and you choose not to have one, there is a rare chance of getting tetanus. Sickness from tetanus can be serious. SEEK MEDICAL CARE IF:   You have redness, swelling, or increasing pain in the wound.  You see a red line that goes away from the wound.  You have yellowish-white fluid (pus) coming from the wound.  You have a fever.  You notice a bad smell coming from the wound or dressing.  Your wound breaks open before or  after sutures have been removed.  You notice something coming out of the wound such as wood or glass.  Your wound is on your hand or foot and you cannot move a finger or toe. SEEK IMMEDIATE MEDICAL CARE IF:   Your pain is not controlled with prescribed medicine.  You have severe swelling around the wound causing pain and numbness or a change in color in your arm, hand, leg, or foot.  Your wound splits open and starts bleeding.  You have worsening numbness, weakness, or loss of function of any joint around or beyond the wound.  You develop painful lumps near the wound or on the skin anywhere on your body. MAKE SURE YOU:   Understand these instructions.  Will watch your condition.  Will get help right away if you are not doing well or get worse. Document Released: 06/08/2005 Document Revised: 08/31/2011 Document Reviewed: 12/02/2010 [facility] Patient Information 2014 [place], [place].  Staple removal in 7 days Ibuprofen for discomfort Your tetanus was updated today

## 2013-08-25 NOTE — ED Notes (Addendum)
Pt hit head on metal steps and has laceration to top of head. No loc. Pt sts she fell to the ground after hitting head, denies other injuries.

## 2013-08-25 NOTE — ED Provider Notes (Signed)
CSN: [id number]     Arrival date & time 08/25/13  1245 History   First MD Initiated Contact with Patient 08/25/13 1309     Chief Complaint  Patient presents with  . Head Injury     (Consider location/radiation/quality/duration/timing/severity/associated sxs/prior Treatment) Patient is a 53 y.o. female presenting with head injury. The history is provided by the patient. No language interpreter was used.  Head Injury Head/neck injury location: top of head. Time since incident:  2 hours Associated symptoms: no blurred vision, no double vision, no focal weakness, no loss of consciousness, no memory loss, no nausea, no neck pain, no numbness and no vomiting   Pt is a 53 year old female who presents with a laceration to the top of her head. She reports that she hit her head on a step. She denies any dizziness, confusion, blurry vision, LOC, weakness or difficulty walking. She reports that she feels fine and just came to get this cut on the top of her head fixed.   Past Medical History  Diagnosis Date  . Urinary tract infection   . Anemia     hx of   Past Surgical History  Procedure Laterality Date  . Knee surgery  1998    left  . Inguinal hernia repair  06/07/2012    Procedure: LAPAROSCOPIC INGUINAL HERNIA;  Surgeon: Gayland Curry, MD,FACS;  Location: [place];  Service: General;  Laterality: Left;  . Insertion of mesh  06/07/2012    Procedure: INSERTION OF MESH;  Surgeon: Gayland Curry, MD,FACS;  Location: [place];  Service: General;  Laterality: Left;   No family history on file. History  Substance Use Topics  . Smoking status: Never Smoker   . Smokeless tobacco: Not on file  . Alcohol Use: Yes     Comment: occasional   OB History   Grav Para Term Preterm Abortions TAB SAB Ect Mult Living                 Review of Systems  Eyes: Negative for blurred vision and double vision.  Gastrointestinal: Negative for nausea and vomiting.  Musculoskeletal: Negative for neck pain.    Neurological: Negative for focal weakness, loss of consciousness and numbness.  Psychiatric/Behavioral: Negative for memory loss.      Allergies  Review of patient's allergies indicates no known allergies.  Home Medications   Current Outpatient Rx  Name  Route  Sig  Dispense  Refill  . Norethindrone Acetate-Ethinyl Estrad-FE (LOESTRIN 24 FE) 1-20 MG-MCG(24) tablet   Oral   Take 1 tablet by mouth daily.          BP 143/89  Pulse 67  Temp(Src) 98.2 F (36.8 C) (Oral)  Resp 16  SpO2 99%  LMP 08/11/2013 Physical Exam  Nursing note and vitals reviewed. Constitutional: She is oriented to person, place, and time. She appears well-developed and well-nourished. No distress.  Well-appearing  HENT:  Head: Normocephalic.    Small laceration to top of head, 2cm  Eyes: Conjunctivae and EOM are normal. Pupils are equal, round, and reactive to light.  Neck: Normal range of motion. Neck supple. No JVD present. No tracheal deviation present.  Cardiovascular: Normal rate, regular rhythm and intact distal pulses.   Pulmonary/Chest: Effort normal and breath sounds normal. No respiratory distress. She has no wheezes.  Musculoskeletal: Normal range of motion.  Lymphadenopathy:    She has no cervical adenopathy.  Neurological: She is alert and oriented to person, place, and time. No cranial  nerve deficit. Coordination normal.  Skin: Skin is warm and dry.  Psychiatric: She has a normal mood and affect. Her behavior is normal. Judgment and thought content normal.    ED Course  LACERATION REPAIR Date/Time: 08/25/2013 1:56 PM Performed by: Elisha Headland Authorized by: Elisha Headland Consent: Verbal consent obtained. written consent not obtained. Risks and benefits: risks, benefits and alternatives were discussed Consent given by: patient Patient understanding: patient states understanding of the procedure being performed Site marked: the operative site was marked Patient identity  confirmed: verbally with patient and arm band Time out: Immediately prior to procedure a "time out" was called to verify the correct patient, procedure, equipment, support staff and site/side marked as required. Body area: head/neck Location details: scalp Laceration length: 2 cm Foreign bodies: no foreign bodies Vascular damage: no Anesthesia: local infiltration Local anesthetic: lidocaine 2% with epinephrine Anesthetic total: 2 ml Patient sedated: no Preparation: Patient was prepped and draped in the usual sterile fashion. Irrigation solution: saline Irrigation method: syringe Amount of cleaning: standard Skin closure: staples Number of sutures: 3 Patient tolerance: Patient tolerated the procedure well with no immediate complications.   (including critical care time) Labs Review Labs Reviewed - No data to display Imaging Review No results found.   EKG Interpretation None      MDM   Final diagnoses:  Laceration of head    Suture repair for small laceration, 2cm, to top of head. #3 staples in place. Neuro exam intact. No confusion, dizziness, LOC or focal weakness. Discussed wound care with pt and she agrees. Return in 7 days for staple removal.      Elisha Headland, NP 08/29/13 1947

## 2013-08-25 NOTE — ED Notes (Signed)
MD Walker at bedside for laceration repair with staples.

## 2013-08-30 NOTE — ED Provider Notes (Signed)
Medical screening examination/treatment/procedure(s) were performed by non-physician practitioner and as supervising physician I was immediately available for consultation/collaboration.   EKG Interpretation None        Sharyon Cable, MD 08/30/13 1521

## 2013-10-04 ENCOUNTER — Other Ambulatory Visit: Payer: Self-pay

## 2013-10-04 DIAGNOSIS — Z1231 Encounter for screening mammogram for malignant neoplasm of breast: Secondary | ICD-10-CM

## 2013-10-12 ENCOUNTER — Ambulatory Visit
Admission: RE | Admit: 2013-10-12 | Discharge: 2013-10-12 | Disposition: A | Discharge status: Home / Self Care | Payer: 59 | Source: Ambulatory Visit

## 2013-10-12 DIAGNOSIS — Z1231 Encounter for screening mammogram for malignant neoplasm of breast: Secondary | ICD-10-CM

## 2014-03-01 ENCOUNTER — Ambulatory Visit: Payer: Self-pay | Admitting: Family Medicine

## 2014-03-09 ENCOUNTER — Encounter: Payer: Self-pay | Admitting: Family Medicine

## 2014-03-09 ENCOUNTER — Telehealth: Payer: Self-pay | Admitting: *Deleted

## 2014-03-09 ENCOUNTER — Ambulatory Visit (INDEPENDENT_AMBULATORY_CARE_PROVIDER_SITE_OTHER): Payer: 59 | Admitting: Family Medicine

## 2014-03-09 VITALS — BP 112/76 | HR 72 | Temp 98.1°F | Resp 16 | Ht 65.25 in | Wt 129.2 lb

## 2014-03-09 DIAGNOSIS — R7309 Other abnormal glucose: Secondary | ICD-10-CM

## 2014-03-09 DIAGNOSIS — E161 Other hypoglycemia: Secondary | ICD-10-CM | POA: Insufficient documentation

## 2014-03-09 DIAGNOSIS — Z0000 Encounter for general adult medical examination without abnormal findings: Secondary | ICD-10-CM | POA: Insufficient documentation

## 2014-03-09 DIAGNOSIS — R0789 Other chest pain: Secondary | ICD-10-CM

## 2014-03-09 DIAGNOSIS — Z3041 Encounter for surveillance of contraceptive pills: Secondary | ICD-10-CM | POA: Insufficient documentation

## 2014-03-09 DIAGNOSIS — E162 Hypoglycemia, unspecified: Secondary | ICD-10-CM

## 2014-03-09 DIAGNOSIS — Z23 Encounter for immunization: Secondary | ICD-10-CM

## 2014-03-09 LAB — POCT GLYCOSYLATED HEMOGLOBIN (HGB A1C): HEMOGLOBIN A1C: 5.1

## 2014-03-09 NOTE — Telephone Encounter (Signed)
Faxed signed authorization to release medical records for continuing care, per Dr Leward Quan.

## 2014-03-09 NOTE — Patient Instructions (Signed)
Hypoglycemia °Hypoglycemia occurs when the glucose in your blood is too low. Glucose is a type of sugar that is your body's main energy source. Hormones, such as insulin and glucagon, control the level of glucose in the blood. Insulin lowers blood glucose and glucagon increases blood glucose. Having too much insulin in your blood stream, or not eating enough food containing sugar, can result in hypoglycemia. Hypoglycemia can happen to people with or without diabetes. It can develop quickly and can be a medical emergency.  °CAUSES  °· Missing or delaying meals. °· Not eating enough carbohydrates at meals. °· Taking too much diabetes medicine. °· Not timing your oral diabetes medicine or insulin doses with meals, snacks, and exercise. °· Nausea and vomiting. °· Certain medicines. °· Severe illnesses, such as hepatitis, kidney disorders, and certain eating disorders. °· Increased activity or exercise without eating something extra or adjusting medicines. °· Drinking too much alcohol. °· A nerve disorder that affects body functions like your heart rate, blood pressure, and digestion (autonomic neuropathy). °· A condition where the stomach muscles do not function properly (gastroparesis). Therefore, medicines and food may not absorb properly. °· Rarely, a tumor of the pancreas can produce too much insulin. °SYMPTOMS  °· Hunger. °· Sweating (diaphoresis). °· Change in body temperature. °· Shakiness. °· Headache. °· Anxiety. °· Lightheadedness. °· Irritability. °· Difficulty concentrating. °· Dry mouth. °· Tingling or numbness in the hands or feet. °· Restless sleep or sleep disturbances. °· Altered speech and coordination. °· Change in mental status. °· Seizures or prolonged convulsions. °· Combativeness. °· Drowsiness (lethargic). °· Weakness. °· Increased heart rate or palpitations. °· Confusion. °· Pale, gray skin color. °· Blurred or double vision. °· Fainting. °DIAGNOSIS  °A physical exam and medical history will be  performed. Your caregiver may make a diagnosis based on your symptoms. Blood tests and other lab tests may be performed to confirm a diagnosis. Once the diagnosis is made, your caregiver will see if your signs and symptoms go away once your blood glucose is raised.  °TREATMENT  °Usually, you can easily treat your hypoglycemia when you notice symptoms. °· Check your blood glucose. If it is less than 70 mg/dl, take one of the following:   °¨ 3-4 glucose tablets.   °¨ ½ cup juice.   °¨ ½ cup regular soda.   °¨ 1 cup skim milk.   °¨ ½-1 tube of glucose gel.   °¨ 5-6 hard candies.   °· Avoid high-fat drinks or food that may delay a rise in blood glucose levels. °· Do not take more than the recommended amount of sugary foods, drinks, gel, or tablets. Doing so will cause your blood glucose to go too high.   °· Wait 10-15 minutes and recheck your blood glucose. If it is still less than 70 mg/dl or below your target range, repeat treatment.   °· Eat a snack if it is more than 1 hour until your next meal.   °There may be a time when your blood glucose may go so low that you are unable to treat yourself at home when you start to notice symptoms. You may need someone to help you. You may even faint or be unable to swallow. If you cannot treat yourself, someone will need to bring you to the hospital.  °HOME CARE INSTRUCTIONS °· If you have diabetes, follow your diabetes management plan by: °¨ Taking your medicines as directed. °¨ Following your exercise plan. °¨ Following your meal plan. Do not skip meals. Eat on time. °¨ Testing your blood   glucose regularly. Check your blood glucose before and after exercise. If you exercise longer or different than usual, be sure to check blood glucose more frequently. °¨ Wearing your medical alert jewelry that says you have diabetes. °· Identify the cause of your hypoglycemia. Then, develop ways to prevent the recurrence of hypoglycemia. °· Do not take a hot bath or shower right after an  insulin shot. °· Always carry treatment with you. Glucose tablets are the easiest to carry. °· If you are going to drink alcohol, drink it only with meals. °· Tell friends or family members ways to keep you safe during a seizure. This may include removing hard or sharp objects from the area or turning you on your side. °· Maintain a healthy weight. °SEEK MEDICAL CARE IF:  °· You are having problems keeping your blood glucose in your target range. °· You are having frequent episodes of hypoglycemia. °· You feel you might be having side effects from your medicines. °· You are not sure why your blood glucose is dropping so low. °· You notice a change in vision or a new problem with your vision. °SEEK IMMEDIATE MEDICAL CARE IF:  °· Confusion develops. °· A change in mental status occurs. °· The inability to swallow develops. °· Fainting occurs. °Document Released: 06/08/2005 Document Revised: 06/13/2013 Document Reviewed: 10/05/2011 °[employer]® Patient Information ©2015 [employer]. This information is not intended to replace advice given to you by your health care provider. Make sure you discuss any questions you have with your health care provider. ° °

## 2014-03-11 ENCOUNTER — Encounter: Payer: Self-pay | Admitting: Family Medicine

## 2014-03-11 NOTE — Progress Notes (Signed)
Subjective:    Patient ID: Tammy Meyers, female    DOB: 12/25/1960, 53 y.o.   MRN: [id number]  HPI  This 53 y.o. Cauc female is new to [facility]; pt has GYN care at [facility] w/ Dr. Garwin Brothers. She enjoys very good health and practices an active lifestyle. She c/o episodes of hypoglycemia; these occur whenever she eats a high-sugar item like a donut for breakfast; this problem is prevalent in her family. It does not occur when she consumes foods containing protein.   She also c/o intermittent chest pressure, onset a few weeks ago. No associated symptoms except fleeting palpitations. Caffeine intake limited to I cup coffee in morning and 1 soda in afternoon. Pt attributes symptoms to stress, both job related and in her personal life. There is no family hx of early heart disease. Recent labs at GYN practice did not indicate pt was menopausal.  Patient Active Problem List   Diagnosis Date Noted  . Health care maintenance 03/09/2014  . 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 Uses oral contraception 03/09/2014    Prior to Admission medications   Medication Sig Start Date End Date Taking? Authorizing Provider  Multiple Vitamins-Minerals (MULTI-VITAMIN GUMMIES) CHEW Chew by mouth.   Yes Historical Provider, MD  Norethindrone Acetate-Ethinyl Estrad-FE (LOESTRIN 24 FE) 1-20 MG-MCG(24) tablet Take 1 tablet by mouth daily.   Yes Historical Provider, MD    History   Social History  . Marital Status: Single    Spouse Name: N/A    Number of Children: N/A  . Years of Education: N/A   Occupational History  . Works at [employer].   Social History Main Topics  . Smoking status: Never Smoker   . Smokeless tobacco: Not on file  . Alcohol Use: Yes     Comment: occasional  . Drug Use: No  . Sexual Activity: Not on file   Other Topics Concern  . Not on file   Social History Narrative  . Recently ended a long term relationship    Review of Systems    Constitutional: Negative.   HENT: Negative.   Eyes: Negative.   Respiratory: Positive for chest tightness. Negative for cough, shortness of breath and wheezing.   Cardiovascular: Positive for palpitations. Negative for chest pain and leg swelling.  Gastrointestinal: Negative.   Endocrine: Negative.   Musculoskeletal: Negative.   Skin: Negative.   Neurological: Negative.   Hematological: Negative.   Psychiatric/Behavioral: Negative.        Objective:   Physical Exam  Nursing note and vitals reviewed. Constitutional: She is oriented to person, place, and time. She appears well-developed and well-nourished. No distress.  HENT:  Head: Normocephalic and atraumatic.  Right Ear: External ear normal.  Left Ear: External ear normal.  Nose: Nose normal.  Mouth/Throat: Oropharynx is clear and moist. No oropharyngeal exudate.  Eyes: Conjunctivae and EOM are normal. Pupils are equal, round, and reactive to light. No scleral icterus.  Neck: Normal range of motion. Neck supple. No thyromegaly present.  Cardiovascular: Normal rate, regular rhythm and normal heart sounds.  Exam reveals no gallop and no friction rub.   No murmur heard. Pulmonary/Chest: Effort normal and breath sounds normal. No respiratory distress. She has no wheezes. She exhibits no tenderness.  Abdominal: Soft. Bowel sounds are normal. She exhibits no distension and no mass. There is no tenderness. There is no rebound and no guarding.  Musculoskeletal: Normal range of motion. She exhibits no edema and no tenderness.  Lymphadenopathy:  She has no cervical adenopathy.  Neurological: She is alert and oriented to person, place, and time. She has normal reflexes. No cranial nerve deficit. She exhibits normal muscle tone. Coordination normal.  Skin: Skin is warm and dry. She is not diaphoretic. No erythema. No pallor.  Psychiatric: She has a normal mood and affect. Her behavior is normal. Judgment and thought content normal.     Results for orders placed in visit on 03/09/14  POCT GLYCOSYLATED HEMOGLOBIN (HGB A1C)      Result Value Ref Range   Hemoglobin A1C 5.1         Assessment & Plan:  Postprandial hypoglycemia - Pt is aware of better nutritional habits that will prevent hypoglycemic episodes. Plan: POCT glycosylated hemoglobin (Hb A1C)  Chest pressure- Exam is unremarkable; pt declines ECG today.   Need for prophylactic vaccination and inoculation against influenza - Plan: Flu Vaccine QUAD 36+ mos IM

## 2014-06-22 HISTORY — PX: ABDOMINAL HERNIA REPAIR: SHX539

## 2014-07-13 ENCOUNTER — Ambulatory Visit: Payer: 59 | Admitting: Family Medicine

## 2014-07-19 ENCOUNTER — Ambulatory Visit: Payer: 59 | Admitting: Family Medicine

## 2014-08-09 ENCOUNTER — Encounter: Payer: Self-pay | Admitting: Family Medicine

## 2014-08-09 ENCOUNTER — Ambulatory Visit (INDEPENDENT_AMBULATORY_CARE_PROVIDER_SITE_OTHER): Payer: 59 | Admitting: Family Medicine

## 2014-08-09 VITALS — BP 119/74 | HR 64 | Temp 98.2°F | Resp 16 | Ht 65.0 in | Wt 124.8 lb

## 2014-08-09 DIAGNOSIS — Z1212 Encounter for screening for malignant neoplasm of rectum: Secondary | ICD-10-CM

## 2014-08-09 DIAGNOSIS — Z13220 Encounter for screening for lipoid disorders: Secondary | ICD-10-CM

## 2014-08-09 DIAGNOSIS — Z0000 Encounter for general adult medical examination without abnormal findings: Secondary | ICD-10-CM

## 2014-08-09 DIAGNOSIS — Z130 Encounter for screening for diseases of the blood and blood-forming organs and certain disorders involving the immune mechanism: Secondary | ICD-10-CM

## 2014-08-09 DIAGNOSIS — Z1211 Encounter for screening for malignant neoplasm of colon: Secondary | ICD-10-CM

## 2014-08-09 DIAGNOSIS — E161 Other hypoglycemia: Secondary | ICD-10-CM

## 2014-08-09 DIAGNOSIS — Z1159 Encounter for screening for other viral diseases: Secondary | ICD-10-CM

## 2014-08-09 DIAGNOSIS — E162 Hypoglycemia, unspecified: Secondary | ICD-10-CM

## 2014-08-09 LAB — LIPID PANEL
CHOL/HDL RATIO: 3 ratio
CHOLESTEROL: 246 mg/dL — AB (ref 0–200)
HDL: 81 mg/dL (ref >39)
LDL CALC: 149 mg/dL — AB (ref 0–99)
Triglycerides: 82 mg/dL (ref <150)
VLDL: 16 mg/dL (ref 0–40)

## 2014-08-09 LAB — CBC WITH DIFFERENTIAL/PLATELET
BASOS PCT: 0 % (ref 0–1)
Basophils Absolute: 0 10*3/uL (ref 0.0–0.1)
EOS ABS: 0.1 10*3/uL (ref 0.0–0.7)
EOS PCT: 2 % (ref 0–5)
HEMATOCRIT: 37.7 % (ref 36.0–46.0)
Hemoglobin: 12.5 g/dL (ref 12.0–15.0)
LYMPHS ABS: 2.3 10*3/uL (ref 0.7–4.0)
Lymphocytes Relative: 35 % (ref 12–46)
MCH: 29.1 pg (ref 26.0–34.0)
MCHC: 33.2 g/dL (ref 30.0–36.0)
MCV: 87.9 fL (ref 78.0–100.0)
MONO ABS: 0.6 10*3/uL (ref 0.1–1.0)
MONOS PCT: 9 % (ref 3–12)
MPV: 11.8 fL (ref 8.6–12.4)
NEUTROS PCT: 54 % (ref 43–77)
Neutro Abs: 3.6 10*3/uL (ref 1.7–7.7)
Platelets: 274 10*3/uL (ref 150–400)
RBC: 4.29 MIL/uL (ref 3.87–5.11)
RDW: 14.1 % (ref 11.5–15.5)
WBC: 6.7 10*3/uL (ref 4.0–10.5)

## 2014-08-09 LAB — COMPLETE METABOLIC PANEL WITH GFR
ALT: 10 U/L (ref 0–35)
AST: 12 U/L (ref 0–37)
Albumin: 4 g/dL (ref 3.5–5.2)
Alkaline Phosphatase: 12 U/L — ABNORMAL LOW (ref 39–117)
BILIRUBIN TOTAL: 0.5 mg/dL (ref 0.2–1.2)
BUN: 13 mg/dL (ref 6–23)
CHLORIDE: 104 meq/L (ref 96–112)
CO2: 25 mEq/L (ref 19–32)
CREATININE: 0.88 mg/dL (ref 0.50–1.10)
Calcium: 9.1 mg/dL (ref 8.4–10.5)
GFR, Est African American: 86 mL/min
GFR, Est Non African American: 75 mL/min
Glucose, Bld: 87 mg/dL (ref 70–99)
Potassium: 4.7 mEq/L (ref 3.5–5.3)
Sodium: 138 mEq/L (ref 135–145)
Total Protein: 7 g/dL (ref 6.0–8.3)

## 2014-08-09 LAB — HEPATITIS C ANTIBODY: HCV AB: NEGATIVE

## 2014-08-09 LAB — VITAMIN D 25 HYDROXY (VIT D DEFICIENCY, FRACTURES): VIT D 25 HYDROXY: 33 ng/mL (ref 30–100)

## 2014-08-09 MED ORDER — DESLORATADINE 5 MG PO TABS: 5.0000 mg | ORAL_TABLET | Freq: Every day | ORAL | Status: DC

## 2014-08-09 NOTE — Patient Instructions (Addendum)
Keeping You Healthy  Get These Tests  Blood Pressure- Have your blood pressure checked by your healthcare provider at least once a year.  Normal blood pressure is 120/80.  Weight- Have your body mass index (BMI) calculated to screen for obesity.  BMI is a measure of body fat based on height and weight.  You can calculate your own BMI at www.nhlbisupport.com/bmi/  Cholesterol- Have your cholesterol checked every year.  Diabetes- Have your blood sugar checked every year if you have high blood pressure, high cholesterol, a family history of diabetes or if you are overweight.  Pap Smear- Have a pap smear every 1 to 3 years if you have been sexually active.  If you are older than 65 and recent pap smears have been normal you may not need additional pap smears.  In addition, if you have had a hysterectomy  For benign disease additional pap smears are not necessary.  Mammogram-Yearly mammograms are essential for early detection of breast cancer  Screening for Colon Cancer- Colonoscopy starting at age 50. Screening may begin sooner depending on your family history and other health conditions.  Follow up colonoscopy as directed by your Gastroenterologist.  Screening for Osteoporosis- Screening begins at age 65 with bone density scanning, sooner if you are at higher risk for developing Osteoporosis.  Get these medicines  Calcium with Vitamin D- Your body requires 1200-1500 mg of Calcium a day and 800-1000 IU of Vitamin D a day.  You can only absorb 500 mg of Calcium at a time therefore Calcium must be taken in 2 or 3 separate doses throughout the day.  Hormones- Hormone therapy has been associated with increased risk for certain cancers and heart disease.  Talk to your healthcare provider about if you need relief from menopausal symptoms.  Aspirin- Ask your healthcare provider about taking Aspirin to prevent Heart Disease and Stroke.  Get these Immuniztions  Flu shot- Every fall  Pneumonia  shot- Once after the age of 65; if you are younger ask your healthcare provider if you need a pneumonia shot.  Tetanus- Every ten years.  Zostavax- Once after the age of 60 to prevent shingles.  Take these steps  Don't smoke- Your healthcare provider can help you quit. For tips on how to quit, ask your healthcare provider or go to www.smokefree.gov or call 1-800 QUIT-NOW.  Be physically active- Exercise 5 days a week for a minimum of 30 minutes.  If you are not already physically active, start slow and gradually work up to 30 minutes of moderate physical activity.  Try walking, dancing, bike riding, swimming, etc.  Eat a healthy diet- Eat a variety of healthy foods such as fruits, vegetables, whole grains, low fat milk, low fat cheeses, yogurt, lean meats, chicken, fish, eggs, dried beans, tofu, etc.  For more information go to www.thenutritionsource.org  Dental visit- Brush and floss teeth twice daily; visit your dentist twice a year.  Eye exam- Visit your Optometrist or Ophthalmologist yearly.  Drink alcohol in moderation- Limit alcohol intake to one drink or less a day.  Never drink and drive.  Depression- Your emotional health is as important as your physical health.  If you're feeling down or losing interest in things you normally enjoy, please talk to your healthcare provider.  Seat Belts- can save your life; always wear one  Smoke/Carbon Monoxide detectors- These detectors need to be installed on the appropriate level of your home.  Replace batteries at least once a year.  Violence- If anyone   is threatening or hurting you, please tell your healthcare provider.  Living Will/ Health care power of attorney- Discuss with your healthcare provider and family.       Mediterranean Diet  Why follow it? Research shows.   Those who follow the Mediterranean diet have a reduced risk of heart disease    The diet is associated with a reduced incidence of Parkinson's and Alzheimer's  diseases   People following the diet may have longer life expectancies and lower rates of chronic diseases    The Dietary Guidelines for Americans recommends the Mediterranean diet as an eating plan to promote health and prevent disease  What Is the Mediterranean Diet?    Healthy eating plan based on typical foods and recipes of Mediterranean-style cooking   The diet is primarily a plant based diet; these foods should make up a majority of meals   Starches - Plant based foods should make up a majority of meals - They are an important sources of vitamins, minerals, energy, antioxidants, and fiber - Choose whole grains, foods high in fiber and minimally processed items  - Typical grain sources include wheat, oats, barley, corn, brown rice, bulgar, farro, millet, polenta, couscous  - Various types of beans include chickpeas, lentils, fava beans, black beans, white beans   Fruits  Veggies - Large quantities of antioxidant rich fruits & veggies; 6 or more servings  - Vegetables can be eaten raw or lightly drizzled with oil and cooked  - Vegetables common to the traditional Mediterranean Diet include: artichokes, arugula, beets, broccoli, brussel sprouts, cabbage, carrots, celery, collard greens, cucumbers, eggplant, kale, leeks, lemons, lettuce, mushrooms, okra, onions, peas, peppers, potatoes, pumpkin, radishes, rutabaga, shallots, spinach, sweet potatoes, turnips, zucchini - Fruits common to the Mediterranean Diet include: apples, apricots, avocados, cherries, clementines, dates, figs, grapefruits, grapes, melons, nectarines, oranges, peaches, pears, pomegranates, strawberries, tangerines  Fats - Replace butter and margarine with healthy oils, such as olive oil, canola oil, and tahini  - Limit nuts to no more than a handful a day  - Nuts include walnuts, almonds, pecans, pistachios, pine nuts  - Limit or avoid candied, honey roasted or heavily salted nuts - Olives are central to the Mediterranean  diet - can be eaten whole or used in a variety of dishes   Meats Protein - Limiting red meat: no more than a few times a month - When eating red meat: choose lean cuts and keep the portion to the size of deck of cards - Eggs: approx. 0 to 4 times a week  - Fish and lean poultry: at least 2 a week  - Healthy protein sources include, chicken, turkey, lean beef, lamb - Increase intake of seafood such as tuna, salmon, trout, mackerel, shrimp, scallops - Avoid or limit high fat processed meats such as sausage and bacon  Dairy - Include moderate amounts of low fat dairy products  - Focus on healthy dairy such as fat free yogurt, skim milk, low or reduced fat cheese - Limit dairy products higher in fat such as whole or 2% milk, cheese, ice cream  Alcohol - Moderate amounts of red wine is ok  - No more than 5 oz daily for women (all ages) and men older than age 65  - No more than 10 oz of wine daily for men younger than 65  Other - Limit sweets and other desserts  - Use herbs and spices instead of salt to flavor foods  - Herbs and spices common to   the traditional Mediterranean Diet include: basil, bay leaves, chives, cloves, cumin, fennel, garlic, lavender, marjoram, mint, oregano, parsley, pepper, rosemary, sage, savory, sumac, tarragon, thyme   It's not just a diet, it's a lifestyle:    The Mediterranean diet includes lifestyle factors typical of those in the region    Foods, drinks and meals are best eaten with others and savored   Daily physical activity is important for overall good health   This could be strenuous exercise like running and aerobics   This could also be more leisurely activities such as walking, housework, yard-work, or taking the stairs   Moderation is the key; a balanced and healthy diet accommodates most foods and drinks   Consider portion sizes and frequency of consumption of certain foods   Meal Ideas & Options:    Breakfast:  o Whole wheat toast or whole wheat English  muffins with peanut butter & hard boiled egg o Steel cut oats topped with apples & cinnamon and skim milk  o Fresh fruit: banana, strawberries, melon, berries, peaches  o Smoothies: strawberries, bananas, greek yogurt, peanut butter o Low fat greek yogurt with blueberries and granola  o Egg white omelet with spinach and mushrooms o Breakfast couscous: whole wheat couscous, apricots, skim milk, cranberries    Sandwiches:  o Hummus and grilled vegetables (peppers, zucchini, squash) on whole wheat bread   o Grilled chicken on whole wheat pita with lettuce, tomatoes, cucumbers or tzatziki  o Tuna salad on whole wheat bread: tuna salad made with greek yogurt, olives, red peppers, capers, green onions o Garlic rosemary lamb pita: lamb sauted with garlic, rosemary, salt & pepper; add lettuce, cucumber, greek yogurt to pita - flavor with lemon juice and black pepper    Seafood:  o Mediterranean grilled salmon, seasoned with garlic, basil, parsley, lemon juice and black pepper o Shrimp, lemon, and spinach whole-grain pasta salad made with low fat greek yogurt  o Seared scallops with lemon orzo  o Seared tuna steaks seasoned salt, pepper, coriander topped with tomato mixture of olives, tomatoes, olive oil, minced garlic, parsley, green onions and cappers    Meats:  o Herbed greek chicken salad with kalamata olives, cucumber, feta  o Red bell peppers stuffed with spinach, bulgur, lean ground beef (or lentils) & topped with feta   o Kebabs: skewers of chicken, tomatoes, onions, zucchini, squash  o Turkey burgers: made with red onions, mint, dill, lemon juice, feta cheese topped with roasted red peppers   Vegetarian o Cucumber salad: cucumbers, artichoke hearts, celery, red onion, feta cheese, tossed in olive oil & lemon juice  o Hummus and whole grain pita points with a greek salad (lettuce, tomato, feta, olives, cucumbers, red onion) o Lentil soup with celery, carrots made with vegetable broth,  garlic, salt and pepper  o Tabouli salad: parsley, bulgur, mint, scallions, cucumbers, tomato, radishes, lemon juice, olive oil, salt and pepper. o   

## 2014-08-09 NOTE — Progress Notes (Signed)
Subjective:    Patient ID: Tammy Meyers, female    DOB: April 21, 1961, 54 y.o.   MRN: [id number]  HPI  This 54 y.o. Female is here for CPE; GYN w/ Dr. Garwin Brothers (May 2015). Pt typically sees PCP annually for CPE and labs. PMHx  Include metrorrhagia and dyspareunia, evalauted by GYN. Pelvic US in Feb 2014 >> small SS fibroid, normal ovaries and small uterus. Also had benign breast lump in 2014. MMGs normal for last 5 years.  HCM: PAP/MMG- as noted.           IMM- Current.           CRS- To be referred today.           Vision- Current.           Dental- Twice a year.  Patient Active Problem List   Diagnosis Date Noted  . Health care maintenance 03/09/2014  . 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 Uses oral contraception 03/09/2014    Prior to Admission medications   Medication Sig Start Date End Date Taking? Authorizing Provider  Multiple Vitamins-Minerals (MULTI-VITAMIN GUMMIES) CHEW Chew by mouth.   Yes Historical Provider, MD  Norethindrone Acetate-Ethinyl Estrad-FE (LOESTRIN 24 FE) 1-20 MG-MCG(24) tablet Take 1 tablet by mouth daily.   Yes Historical Provider, MD    Past Surgical History  Procedure Laterality Date  . Knee surgery  1998    left  . Inguinal hernia repair  06/07/2012    Procedure: LAPAROSCOPIC INGUINAL HERNIA;  Surgeon: Gayland Curry, MD,FACS;  Location: [facility];  Service: General;  Laterality: Left;  . Insertion of mesh  06/07/2012    Procedure: INSERTION OF MESH;  Surgeon: Gayland Curry, MD,FACS;  Location: [facility];  Service: General;  Laterality: Left;    Family History  Problem Relation Age of Onset  . Stroke Mother   . Mental illness Mother   . Stroke Father   . Mental illness Father     History   Social History  . Marital Status: Single    Spouse Name: N/A  . Number of Children: N/A  . Years of Education: N/A   Occupational History  . Not on file.   Social History Main Topics  . Smoking status: Never Smoker   . Smokeless tobacco: Not on  file  . Alcohol Use: Yes     Comment: occasional  . Drug Use: No  . Sexual Activity: Not on file   Other Topics Concern  . Not on file   Social History Narrative    Review of Systems  Constitutional: Negative.   HENT: Positive for postnasal drip and rhinorrhea. Negative for congestion, sore throat and trouble swallowing.   Eyes: Negative.   Respiratory: Positive for cough.        Coughs after eating (intermittent) and clears throat a lot.  Cardiovascular: Negative.   Gastrointestinal: Negative.   Endocrine: Negative.   Genitourinary: Negative.   Musculoskeletal: Negative.   Skin:       Has a NT soft lump on arm.  Allergic/Immunologic: Negative.   Neurological: Negative.   Hematological: Negative.   Psychiatric/Behavioral: Negative.       Objective:   Physical Exam  Constitutional: She is oriented to person, place, and time. Vital signs are normal. She appears well-developed and well-nourished. No distress.  Blood pressure 119/74, pulse 64, temperature 98.2 F (36.8 C), temperature source Oral, resp. rate 16, height 5\' 5"  (1.651 m), weight 124 lb 12.8 oz (56.609 kg), last menstrual  period 07/09/2014, SpO2 100 %.   HENT:  Head: Normocephalic and atraumatic.  Right Ear: Hearing, tympanic membrane, external ear and ear canal normal.  Left Ear: Hearing, tympanic membrane, external ear and ear canal normal.  Nose: Nose normal. No nasal deformity or septal deviation.  Mouth/Throat: Uvula is midline and mucous membranes are normal. No oral lesions. Normal dentition. No dental caries. Posterior oropharyngeal erythema present. No oropharyngeal exudate.  Posterior pharynx w/ cobblestone appearance.  Eyes: Conjunctivae, EOM and lids are normal. Pupils are equal, round, and reactive to light. No scleral icterus.  Neck: Trachea normal, normal range of motion, full passive range of motion without pain and phonation normal. Neck supple. No spinous process tenderness and no muscular  tenderness present. No thyroid mass and no thyromegaly present.  Cardiovascular: Normal rate, regular rhythm, S1 normal, S2 normal, normal heart sounds, intact distal pulses and normal pulses.   No extrasystoles are present. PMI is not displaced.  Exam reveals no gallop and no friction rub.   No murmur heard. Pulmonary/Chest: Effort normal and breath sounds normal. No respiratory distress. She has no decreased breath sounds. She has no wheezes.  Abdominal: Soft. Normal appearance, normal aorta and bowel sounds are normal. She exhibits no distension and no mass. There is no hepatosplenomegaly. There is no tenderness. There is no guarding and no CVA tenderness.  Genitourinary:  Deferred.  Musculoskeletal:       Cervical back: Normal.       Thoracic back: Normal.       Lumbar back: Normal.  Remainder of exam unremarkable.  Lymphadenopathy:       Head (right side): No submental, no submandibular, no tonsillar, no preauricular, no posterior auricular and no occipital adenopathy present.       Head (left side): No submental, no submandibular, no tonsillar, no preauricular, no posterior auricular and no occipital adenopathy present.    She has no cervical adenopathy.    She has no axillary adenopathy.       Right: No inguinal, no supraclavicular and no epitrochlear adenopathy present.       Left: No inguinal and no epitrochlear adenopathy present.  Neurological: She is alert and oriented to person, place, and time. She has normal strength and normal reflexes. She displays no atrophy. No cranial nerve deficit or sensory deficit. She exhibits normal muscle tone. She displays a negative Romberg sign. Coordination and gait normal.  Skin: Skin is warm, dry and intact. No ecchymosis, no lesion and no rash noted. She is not diaphoretic. No cyanosis or erythema. No pallor. Nails show no clubbing.  R arm- upper out aspect- soft mobile NT 3.5-4 cm mass.  Psychiatric: She has a normal mood and affect. Her speech  is normal and behavior is normal. Judgment and thought content normal. Cognition and memory are normal.  Nursing note and vitals reviewed.      Assessment & Plan:  Well woman exam (no gynecological exam)- C/o cough and throat clearing due to rhinitis and PND; trial of antihistamine and saline nasal mist. Also advised to get a humidifier.  Postprandial hypoglycemia - Continue eating small meals and snacks throughout the day to keep BS stable. Plan: COMPLETE METABOLIC PANEL WITH GFR, Vitamin D, 25-hydroxy  Need for hepatitis C screening test - Plan: Hepatitis C antibody  Screening for lipid disorders - Plan: Lipid panel, Vitamin D, 25-hydroxy  Screening for deficiency anemia - Plan: CBC with Differential/Platelet, COMPLETE METABOLIC PANEL WITH GFR  Screening for colorectal cancer - Plan: Ambulatory  referral to Gastroenterology  Meds ordered this encounter  Medications  . desloratadine (CLARINEX) 5 MG tablet    Sig: Take 1 tablet (5 mg total) by mouth daily.    Dispense:  30 tablet    Refill:  5

## 2014-08-10 ENCOUNTER — Encounter: Payer: Self-pay | Admitting: Internal Medicine

## 2014-08-11 ENCOUNTER — Encounter: Payer: Self-pay | Admitting: Family Medicine

## 2014-09-11 ENCOUNTER — Other Ambulatory Visit: Payer: Self-pay

## 2014-09-11 DIAGNOSIS — Z1231 Encounter for screening mammogram for malignant neoplasm of breast: Secondary | ICD-10-CM

## 2014-09-26 ENCOUNTER — Ambulatory Visit (AMBULATORY_SURGERY_CENTER): Payer: Self-pay | Admitting: *Deleted

## 2014-09-26 VITALS — Ht 65.0 in | Wt 126.4 lb

## 2014-09-26 DIAGNOSIS — Z1211 Encounter for screening for malignant neoplasm of colon: Secondary | ICD-10-CM

## 2014-09-26 MED ORDER — MOVIPREP 100 G PO SOLR: ORAL | Status: DC

## 2014-09-26 NOTE — Progress Notes (Signed)
No egg or soy allergy  No anesthesia or intubation problems per pt  No diet medications taken  Registered in EMMI   

## 2014-10-09 ENCOUNTER — Encounter: Payer: Self-pay | Admitting: Internal Medicine

## 2014-10-10 ENCOUNTER — Encounter: Payer: Self-pay | Admitting: Internal Medicine

## 2014-10-15 ENCOUNTER — Ambulatory Visit
Admission: RE | Admit: 2014-10-15 | Discharge: 2014-10-15 | Disposition: A | Discharge status: Home / Self Care | Payer: 59 | Source: Ambulatory Visit

## 2014-10-15 DIAGNOSIS — Z1231 Encounter for screening mammogram for malignant neoplasm of breast: Secondary | ICD-10-CM

## 2014-10-17 ENCOUNTER — Encounter: Payer: Self-pay | Admitting: Internal Medicine

## 2014-10-17 ENCOUNTER — Ambulatory Visit (AMBULATORY_SURGERY_CENTER): Payer: 59 | Admitting: Internal Medicine

## 2014-10-17 VITALS — BP 106/68 | HR 67 | Temp 97.4°F | Resp 29 | Ht 65.0 in | Wt 126.0 lb

## 2014-10-17 DIAGNOSIS — Z1211 Encounter for screening for malignant neoplasm of colon: Secondary | ICD-10-CM

## 2014-10-17 MED ORDER — SODIUM CHLORIDE 0.9 % IV SOLN
500.0000 mL | INTRAVENOUS | Status: DC
Start: 2014-10-17 — End: 2014-10-18

## 2014-10-17 NOTE — Patient Instructions (Signed)
YOU HAD AN ENDOSCOPIC PROCEDURE TODAY AT [facility]:   Refer to the procedure report that was given to you for any specific questions about what was found during the examination.  If the procedure report does not answer your questions, please call your gastroenterologist to clarify.  If you requested that your care partner not be given the details of your procedure findings, then the procedure report has been included in a sealed envelope for you to review at your convenience later.  YOU SHOULD EXPECT: Some feelings of bloating in the abdomen. Passage of more gas than usual.  Walking can help get rid of the air that was put into your GI tract during the procedure and reduce the bloating. If you had a lower endoscopy (such as a colonoscopy or flexible sigmoidoscopy) you may notice spotting of blood in your stool or on the toilet paper. If you underwent a bowel prep for your procedure, you may not have a normal bowel movement for a few days.  Please Note:  You might notice some irritation and congestion in your nose or some drainage.  This is from the oxygen used during your procedure.  There is no need for concern and it should clear up in a day or so.  SYMPTOMS TO REPORT IMMEDIATELY:   Following lower endoscopy (colonoscopy or flexible sigmoidoscopy):  Excessive amounts of blood in the stool  Significant tenderness or worsening of abdominal pains  Swelling of the abdomen that is new, acute  Fever of 100F or higher  For urgent or emergent issues, a gastroenterologist can be reached at any hour by calling [phone].  DIET: Your first meal following the procedure should be a small meal and then it is ok to progress to your normal diet. Heavy or fried foods are harder to digest and may make you feel nauseous or bloated.  Likewise, meals heavy in dairy and vegetables can increase bloating.  Drink plenty of fluids but you should avoid alcoholic beverages for 24 hours.  ACTIVITY:   You should plan to take it easy for the rest of today and you should NOT DRIVE or use heavy machinery until tomorrow (because of the sedation medicines used during the test).    FOLLOW UP: Our staff will call the number listed on your records the next business day following your procedure to check on you and address any questions or concerns that you may have regarding the information given to you following your procedure. If we do not reach you, we will leave a message.  However, if you are feeling well and you are not experiencing any problems, there is no need to return our call.  We will assume that you have returned to your regular daily activities without incident.  SIGNATURES/CONFIDENTIALITY: You and/or your care partner have signed paperwork which will be entered into your electronic medical record.  These signatures attest to the fact that that the information above on your After Visit Summary has been reviewed and is understood.  Full responsibility of the confidentiality of this discharge information lies with you and/or your care-partner.  Continue your normal medications  Please read over handout about high fiber diets  Next colonoscopy- 10 years

## 2014-10-17 NOTE — Progress Notes (Signed)
  [facility] Post-op Note  Patient: Tammy Meyers  Procedure(s) Performed: colonoscopy  Patient Location: [facility] - Recovery Area  Anesthesia Type: Deep Sedation/Propofol  Level of Consciousness: awake, oriented and patient cooperative  Airway and Oxygen Therapy: Patient Spontanous Breathing  Post-op Pain: none  Post-op Assessment:  Post-op Vital signs reviewed, Patient's Cardiovascular Status Stable, Respiratory Function Stable, Patent Airway, No signs of Nausea or vomiting and Pain level controlled  Post-op Vital Signs: Reviewed and stable  Complications: No apparent anesthesia complications  Chaunda Vandergriff E 11:53 AM

## 2014-10-17 NOTE — Op Note (Signed)
[facility]  [address] [place], [address]   COLONOSCOPY PROCEDURE REPORT  PATIENT: Tammy, Meyers  MR#: [id number] BIRTHDATE: 04-14-1961 , 74  yrs. old GENDER: female ENDOSCOPIST: Lafayette Dragon, MD REFERRED EH:[id number] McPherson, M.D. PROCEDURE DATE:  10/17/2014 PROCEDURE:   Colonoscopy, screening First Screening Colonoscopy - Avg.  risk and is 50 yrs.  old or older Yes.  Prior Negative Screening - Now for repeat screening. N/A  History of Adenoma - Now for follow-up colonoscopy & has been > or = to 3 yrs.  N/A ASA CLASS:   Class I INDICATIONS:Screening for colonic neoplasia and Colorectal Neoplasm Risk Assessment for this procedure is average risk. MEDICATIONS: Monitored anesthesia care and Propofol 350 mg IV  DESCRIPTION OF PROCEDURE:   After the risks benefits and alternatives of the procedure were thoroughly explained, informed consent was obtained.  The digital rectal exam revealed no abnormalities of the rectum.   The [id number]  endoscope was introduced through the anus and advanced to the cecum, which was identified by both the appendix and ileocecal valve. No adverse events experienced.   The quality of the prep was good.  (MoviPrep was used)  The instrument was then slowly withdrawn as the colon was fully examined.      COLON FINDINGS: There was mild diverticulosis noted in the sigmoid colon and descending colon.  Retroflexed views revealed no abnormalities. The time to cecum = 12.00 Withdrawal time = 4.53 The scope was withdrawn and the procedure completed. COMPLICATIONS: There were no immediate complications.  ENDOSCOPIC IMPRESSION: Mild diverticulosis was noted in the sigmoid colon and descending colon  RECOMMENDATIONS: High-fiber diet Recall colonoscopy in 10 years  eSigned:  Lafayette Dragon, MD 10/17/2014 11:53 AM   cc:

## 2014-10-18 ENCOUNTER — Telehealth: Payer: Self-pay | Admitting: *Deleted

## 2014-10-18 NOTE — Telephone Encounter (Signed)
  Follow up Call-  Call back number 10/17/2014  Post procedure Call Back phone  # [phone]  Permission to leave phone message Yes     Patient questions:  Do you have a fever, pain , or abdominal swelling? No. Pain Score  0 *  Have you tolerated food without any problems? Yes.    Have you been able to return to your normal activities? Yes.    Do you have any questions about your discharge instructions: Diet   No. Medications  No. Follow up visit  No.  Do you have questions or concerns about your Care? No.  Actions: * If pain score is 4 or above: No action needed, pain <4.

## 2014-11-20 ENCOUNTER — Telehealth: Payer: Self-pay

## 2014-11-20 NOTE — Telephone Encounter (Signed)
Pt last saw Dr Leward Quan for CPE in February. Can note be written for pt?

## 2014-11-20 NOTE — Telephone Encounter (Signed)
Patient states that she needs a letter written stating that she is in good physical health, etc in order for her to participate in an athletic camp.   [phone]

## 2014-11-20 NOTE — Telephone Encounter (Signed)
Patient would like the letter faxed to [phone]

## 2014-11-21 NOTE — Telephone Encounter (Signed)
Calling to check on status of her letter.   Please call [phone] (H)

## 2014-11-21 NOTE — Telephone Encounter (Signed)
She was seen in Feb 2016 for a cpe with no issues. She is ok to participate in the camp. If you print a form I am here all day today and tomorrow to sign. Thanks.

## 2014-11-22 ENCOUNTER — Encounter: Payer: Self-pay | Admitting: Physician Assistant

## 2014-11-22 NOTE — Telephone Encounter (Signed)
Letter printed. Pt notified to p/u.

## 2014-11-22 NOTE — Telephone Encounter (Signed)
Jonelle Sidle I can't see where to do this. I'm out the next few days, can you please have someone who will be on print and sign tomorrow? Thanks.

## 2014-11-22 NOTE — Telephone Encounter (Signed)
Pt called checking on status of this previous message. Please advise at [phone]

## 2014-11-22 NOTE — Telephone Encounter (Signed)
Letter done. Please print from letter tab and sign, then place in the nurses box. Thanks.

## 2015-11-29 ENCOUNTER — Other Ambulatory Visit: Payer: Self-pay | Admitting: Obstetrics and Gynecology

## 2015-11-29 DIAGNOSIS — Z1231 Encounter for screening mammogram for malignant neoplasm of breast: Secondary | ICD-10-CM

## 2015-12-12 ENCOUNTER — Ambulatory Visit
Admission: RE | Admit: 2015-12-12 | Discharge: 2015-12-12 | Disposition: A | Discharge status: Home / Self Care | Payer: 59 | Source: Ambulatory Visit | Attending: Obstetrics and Gynecology | Admitting: Obstetrics and Gynecology

## 2015-12-12 DIAGNOSIS — Z1231 Encounter for screening mammogram for malignant neoplasm of breast: Secondary | ICD-10-CM

## 2016-06-22 DIAGNOSIS — E7800 Pure hypercholesterolemia, unspecified: Secondary | ICD-10-CM

## 2016-06-22 HISTORY — DX: Pure hypercholesterolemia, unspecified: E78.00

## 2016-09-25 ENCOUNTER — Telehealth: Payer: Self-pay

## 2016-09-25 NOTE — Telephone Encounter (Signed)
Pre visit call made to patient. Left message for return call. 

## 2016-09-25 NOTE — Telephone Encounter (Signed)
Please call patient back.    PC

## 2016-09-28 ENCOUNTER — Encounter: Payer: Self-pay | Admitting: Family

## 2016-09-28 ENCOUNTER — Ambulatory Visit (INDEPENDENT_AMBULATORY_CARE_PROVIDER_SITE_OTHER): Payer: 59 | Admitting: Family

## 2016-09-28 ENCOUNTER — Other Ambulatory Visit (HOSPITAL_COMMUNITY)
Admission: RE | Admit: 2016-09-28 | Discharge: 2016-09-28 | Disposition: A | Discharge status: Home / Self Care | Payer: 59 | Source: Ambulatory Visit | Attending: Family | Admitting: Family

## 2016-09-28 VITALS — BP 125/74 | HR 63 | Temp 98.2°F | Resp 16 | Ht 65.0 in | Wt 120.0 lb

## 2016-09-28 DIAGNOSIS — Z01419 Encounter for gynecological examination (general) (routine) without abnormal findings: Secondary | ICD-10-CM | POA: Insufficient documentation

## 2016-09-28 DIAGNOSIS — Z0000 Encounter for general adult medical examination without abnormal findings: Secondary | ICD-10-CM

## 2016-09-28 LAB — URINALYSIS, ROUTINE W REFLEX MICROSCOPIC
Bilirubin Urine: NEGATIVE
Hgb urine dipstick: NEGATIVE
Ketones, ur: NEGATIVE
LEUKOCYTES UA: NEGATIVE
NITRITE: NEGATIVE
RBC / HPF: NONE SEEN (ref 0–?)
Total Protein, Urine: NEGATIVE
UROBILINOGEN UA: 0.2 (ref 0.0–1.0)
Urine Glucose: NEGATIVE
pH: 6.5 (ref 5.0–8.0)

## 2016-09-28 NOTE — Addendum Note (Signed)
Addended by: Kelle Darting A on: 09/28/2016 11:43 AM   Modules accepted: Orders

## 2016-09-28 NOTE — Patient Instructions (Signed)
Please schedule a follow up fasting lab visit at the front desk.  You can schedule your mammogram/bone density on the first floor to be done after 12/12/16. Welcome to [facility]!

## 2016-09-28 NOTE — Progress Notes (Signed)
Subjective:    Patient ID: Tammy Meyers, female    DOB: [birth year], 56 y.o.   MRN: [id number]  HPI    Patient presents today for complete physical.  Immunizations: 2015  Diet: generally healthy Exercise: 3 + times a week, on a rowing team, classes Colonoscopy: 10/17/14 Dexa: due Pap Smear: 11/20/13 Mammogram:  12/13/15     Review of Systems  Constitutional: Negative for unexpected weight change.  HENT: Negative for hearing loss and rhinorrhea.   Eyes: Negative for visual disturbance.  Respiratory: Negative for cough.   Cardiovascular: Negative for leg swelling.  Gastrointestinal: Negative for blood in stool, constipation and diarrhea.  Genitourinary: Negative for dysuria and frequency.  Musculoskeletal:       Left knee arthritis  Neurological: Negative for dizziness.       Notes occasional headaches, occur if she does not eat for a prolonged time  Hematological: Negative for adenopathy.  Psychiatric/Behavioral:       Denies depression/anxiety   Past Medical History:  Diagnosis Date  . Anemia    hx of  . Elevated cholesterol 2018  . Urinary tract infection      Social History   Social History  . Marital status: Single    Spouse name: N/A  . Number of children: N/A  . Years of education: N/A   Occupational History  . Not on file.   Social History Main Topics  . Smoking status: Never Smoker  . Smokeless tobacco: Never Used  . Alcohol use 0.0 oz/week     Comment: 2 drinks each weekend  . Drug use: No  . Sexual activity: Not on file   Other Topics Concern  . Not on file   Social History Narrative  . No narrative on file    Past Surgical History:  Procedure Laterality Date  . ABDOMINAL HERNIA REPAIR  2016  . INGUINAL HERNIA REPAIR  06/07/2012   Procedure: LAPAROSCOPIC INGUINAL HERNIA;  Surgeon: Gayland Curry, MD,FACS;  Location: [place];  Service: General;  Laterality: Left;  . INSERTION OF MESH  06/07/2012   Procedure: INSERTION OF MESH;   Surgeon: Gayland Curry, MD,FACS;  Location: [place];  Service: General;  Laterality: Left;  . KNEE SURGERY  1998   left. "total reconstruction"    Family History  Problem Relation Age of Onset  . Stroke Mother   . Alcoholism Mother   . Dementia Mother   . Stroke Father   . Alcoholism Father   . Dementia Father   . Arthritis Father   . Colon cancer Neg Hx   . Esophageal cancer Neg Hx   . Rectal cancer Neg Hx   . Stomach cancer Neg Hx     No Known Allergies  No current outpatient prescriptions on file prior to visit.   No current facility-administered medications on file prior to visit.     BP 125/74 (BP Location: Right Arm, Cuff Size: Normal)   Pulse 63   Temp 98.2 F (36.8 C) (Oral)   Resp 16   Ht 5\' 5"  (1.651 m)   Wt 120 lb (54.4 kg)   LMP 06/22/2014   SpO2 100% Comment: room air  BMI 19.97 kg/m        Objective:   Physical Exam  Physical Exam  Constitutional: She is oriented to person, place, and time. She appears well-developed and well-nourished. No distress.  HENT:  Head: Normocephalic and atraumatic.  Right Ear: Tympanic membrane and ear canal normal.  Left Ear:  Tympanic membrane and ear canal normal.  Mouth/Throat: Oropharynx is clear and moist.  Eyes: Pupils are equal, round, and reactive to light. No scleral icterus.  Neck: Normal range of motion. No thyromegaly present.  Cardiovascular: Normal rate and regular rhythm.   No murmur heard. Pulmonary/Chest: Effort normal and breath sounds normal. No respiratory distress. He has no wheezes. She has no rales. She exhibits no tenderness.  Abdominal: Soft. Bowel sounds are normal. She exhibits no distension and no mass. There is no tenderness. There is no rebound and no guarding.  Musculoskeletal: She exhibits no edema.  Lymphadenopathy:    She has no cervical adenopathy.  Neurological: She is alert and oriented to person, place, and time. She has normal patellar reflexes. She exhibits normal muscle tone.  Coordination normal.  Skin: Skin is warm and dry.  Psychiatric: She has a normal mood and affect. Her behavior is normal. Judgment and thought content normal.  Breasts: Examined lying Right: Without masses, retractions, discharge or axillary adenopathy.  Left: Without masses, retractions, discharge or axillary adenopathy.  Inguinal/mons: Normal without inguinal adenopathy  External genitalia: Normal  BUS/Urethra/Skene's glands: Normal  Bladder: Normal  Vagina: Normal  Cervix: Normal  Uterus: normal in size, shape and contour. Midline and mobile  Adnexa/parametria:  Rt: Without masses or tenderness.  Lt: Without masses or tenderness.  Anus and perineum: Normal            Assessment & Plan:        Assessment & Plan:

## 2016-09-28 NOTE — Progress Notes (Signed)
Pre visit review using our clinic review tool, if applicable. No additional management support is needed unless otherwise documented below in the visit note. 

## 2016-09-29 ENCOUNTER — Other Ambulatory Visit (INDEPENDENT_AMBULATORY_CARE_PROVIDER_SITE_OTHER): Payer: 59

## 2016-09-29 DIAGNOSIS — Z0000 Encounter for general adult medical examination without abnormal findings: Secondary | ICD-10-CM

## 2016-09-29 LAB — BASIC METABOLIC PANEL
BUN: 19 mg/dL (ref 6–23)
CALCIUM: 9.3 mg/dL (ref 8.4–10.5)
CO2: 29 meq/L (ref 19–32)
Chloride: 106 mEq/L (ref 96–112)
Creatinine, Ser: 0.95 mg/dL (ref 0.40–1.20)
GFR: 64.58 mL/min (ref >60.00)
GLUCOSE: 89 mg/dL (ref 70–99)
Potassium: 4.2 mEq/L (ref 3.5–5.1)
SODIUM: 140 meq/L (ref 135–145)

## 2016-09-29 LAB — LIPID PANEL
CHOLESTEROL: 266 mg/dL — AB (ref 0–200)
HDL: 94.3 mg/dL (ref >39.00)
LDL CALC: 157 mg/dL — AB (ref 0–99)
NonHDL: 171.65
TRIGLYCERIDES: 71 mg/dL (ref 0.0–149.0)
Total CHOL/HDL Ratio: 3
VLDL: 14.2 mg/dL (ref 0.0–40.0)

## 2016-09-29 LAB — HEPATIC FUNCTION PANEL
ALBUMIN: 3.9 g/dL (ref 3.5–5.2)
ALK PHOS: 27 U/L — AB (ref 39–117)
ALT: 10 U/L (ref 0–35)
AST: 16 U/L (ref 0–37)
Bilirubin, Direct: 0 mg/dL (ref 0.0–0.3)
TOTAL PROTEIN: 6.9 g/dL (ref 6.0–8.3)
Total Bilirubin: 0.4 mg/dL (ref 0.2–1.2)

## 2016-09-29 LAB — CBC WITH DIFFERENTIAL/PLATELET
BASOS ABS: 0 10*3/uL (ref 0.0–0.1)
Basophils Relative: 0.6 % (ref 0.0–3.0)
EOS ABS: 0.2 10*3/uL (ref 0.0–0.7)
Eosinophils Relative: 2.8 % (ref 0.0–5.0)
HCT: 35.6 % — ABNORMAL LOW (ref 36.0–46.0)
HEMOGLOBIN: 11.7 g/dL — AB (ref 12.0–15.0)
LYMPHS ABS: 3.2 10*3/uL (ref 0.7–4.0)
Lymphocytes Relative: 51.5 % — ABNORMAL HIGH (ref 12.0–46.0)
MCHC: 33 g/dL (ref 30.0–36.0)
MCV: 84.5 fl (ref 78.0–100.0)
MONO ABS: 0.5 10*3/uL (ref 0.1–1.0)
Monocytes Relative: 7.7 % (ref 3.0–12.0)
NEUTROS PCT: 37.4 % — AB (ref 43.0–77.0)
Neutro Abs: 2.3 10*3/uL (ref 1.4–7.7)
Platelets: 255 10*3/uL (ref 150.0–400.0)
RBC: 4.21 Mil/uL (ref 3.87–5.11)
RDW: 14 % (ref 11.5–15.5)
WBC: 6.1 10*3/uL (ref 4.0–10.5)

## 2016-09-29 LAB — TSH: TSH: 2.46 u[IU]/mL (ref 0.35–4.50)

## 2016-09-30 LAB — CYTOLOGY - PAP
DIAGNOSIS: NEGATIVE
HPV (WINDOPATH): NOT DETECTED

## 2016-09-30 NOTE — Telephone Encounter (Signed)
Patient has been seen by provider since this message.

## 2016-10-01 ENCOUNTER — Telehealth: Payer: Self-pay | Admitting: Family

## 2016-10-01 DIAGNOSIS — D508 Other iron deficiency anemias: Secondary | ICD-10-CM

## 2016-10-01 DIAGNOSIS — D649 Anemia, unspecified: Secondary | ICD-10-CM

## 2016-10-01 NOTE — Telephone Encounter (Signed)
She is mildly anemic.  I would like her to complete ifob and please ask lab to add on b12, folate, iron (dx anemia).  Thyroid, kidneys, urine all look good. Pap negative. I would like to add on a vitamin D level as well please. Dx low alk phos.   

## 2016-10-01 NOTE — Telephone Encounter (Signed)
Completed Lab Add-on form for Vit B12, Folate, Iron and Vitamin D; also, patient informed, understood & agreed [will p/u IFob]/SLS 04/12

## 2016-10-09 ENCOUNTER — Telehealth: Payer: Self-pay | Admitting: *Deleted

## 2016-10-09 DIAGNOSIS — R748 Abnormal levels of other serum enzymes: Secondary | ICD-10-CM

## 2016-10-09 DIAGNOSIS — D649 Anemia, unspecified: Secondary | ICD-10-CM

## 2016-10-09 NOTE — Telephone Encounter (Signed)
-----   Message from Caffie Pinto sent at 10/01/2016  2:46 PM EDT ----- Regarding: Add on An add on for Vit d, Folate, B12 & Iron was faxed 10-01-16. The blood was to old to do add on per [place] at [place].

## 2016-10-09 NOTE — Telephone Encounter (Signed)
Please advise 

## 2016-10-12 NOTE — Addendum Note (Signed)
Addended by: Kelle Darting A on: 10/12/2016 05:02 PM   Modules accepted: Orders

## 2016-10-12 NOTE — Telephone Encounter (Signed)
Notified pt. States she can do labs tomorrow and appt scheduled for 7:15am. Future orders entered.

## 2016-10-12 NOTE — Telephone Encounter (Signed)
Could you please ask patient to return to the lab for additional testing?

## 2016-10-13 ENCOUNTER — Other Ambulatory Visit (INDEPENDENT_AMBULATORY_CARE_PROVIDER_SITE_OTHER): Payer: 59

## 2016-10-13 DIAGNOSIS — D649 Anemia, unspecified: Secondary | ICD-10-CM | POA: Diagnosis not present

## 2016-10-13 DIAGNOSIS — R748 Abnormal levels of other serum enzymes: Secondary | ICD-10-CM | POA: Diagnosis not present

## 2016-10-13 LAB — FOLATE: Folate: >24 ng/mL (ref >5.9)

## 2016-10-13 LAB — VITAMIN D 25 HYDROXY (VIT D DEFICIENCY, FRACTURES): VITD: 39.49 ng/mL (ref 30.00–100.00)

## 2016-10-13 LAB — VITAMIN B12: Vitamin B-12: 632 pg/mL (ref 211–911)

## 2016-10-13 LAB — IRON: Iron: 93 ug/dL (ref 42–145)

## 2016-11-12 ENCOUNTER — Other Ambulatory Visit (INDEPENDENT_AMBULATORY_CARE_PROVIDER_SITE_OTHER): Payer: 59

## 2016-11-12 DIAGNOSIS — D649 Anemia, unspecified: Secondary | ICD-10-CM | POA: Diagnosis not present

## 2016-11-12 LAB — FECAL OCCULT BLOOD, IMMUNOCHEMICAL: Fecal Occult Bld: NEGATIVE

## 2016-12-15 ENCOUNTER — Ambulatory Visit (HOSPITAL_BASED_OUTPATIENT_CLINIC_OR_DEPARTMENT_OTHER)
Admission: RE | Admit: 2016-12-15 | Discharge: 2016-12-15 | Disposition: A | Discharge status: Home / Self Care | Payer: 59 | Source: Ambulatory Visit | Attending: Family | Admitting: Family

## 2016-12-15 ENCOUNTER — Other Ambulatory Visit: Payer: Self-pay | Admitting: Family

## 2016-12-15 ENCOUNTER — Encounter: Payer: Self-pay | Admitting: Family

## 2016-12-15 DIAGNOSIS — M810 Age-related osteoporosis without current pathological fracture: Secondary | ICD-10-CM | POA: Insufficient documentation

## 2016-12-15 DIAGNOSIS — Z0000 Encounter for general adult medical examination without abnormal findings: Secondary | ICD-10-CM

## 2016-12-15 DIAGNOSIS — Z1231 Encounter for screening mammogram for malignant neoplasm of breast: Secondary | ICD-10-CM | POA: Insufficient documentation

## 2016-12-15 DIAGNOSIS — Z13820 Encounter for screening for osteoporosis: Secondary | ICD-10-CM | POA: Diagnosis not present

## 2016-12-15 MED ORDER — CALCIUM CARBONATE-VITAMIN D 600-400 MG-UNIT PO TABS: 1.0000 | ORAL_TABLET | Freq: Two times a day (BID) | ORAL | Status: DC

## 2016-12-15 MED ORDER — ALENDRONATE SODIUM 70 MG PO TABS: 70.0000 mg | ORAL_TABLET | ORAL | 11 refills | Status: DC

## 2016-12-15 NOTE — Telephone Encounter (Signed)
Notified pt of below and she voices understanding. Lab appt scheduled for tomorrow and orders have been signed. Fosamax Rx sent.

## 2016-12-15 NOTE — Telephone Encounter (Signed)
Please let pt know that the radiologist would like her to complete some additional breast images for further evaluation. Let me know if she has not been contacted by them about a follow up appointment in 1 week.   Also, her bone density is showing osteoporosis.  I would like her to start fosamax for bone health, add caltrate 600mg  +D and also complete additional labs as ordered.  Continue regular exercise.

## 2016-12-16 ENCOUNTER — Other Ambulatory Visit: Payer: Self-pay | Admitting: Family

## 2016-12-16 ENCOUNTER — Other Ambulatory Visit (INDEPENDENT_AMBULATORY_CARE_PROVIDER_SITE_OTHER): Payer: 59

## 2016-12-16 DIAGNOSIS — R928 Other abnormal and inconclusive findings on diagnostic imaging of breast: Secondary | ICD-10-CM

## 2016-12-16 DIAGNOSIS — M810 Age-related osteoporosis without current pathological fracture: Secondary | ICD-10-CM | POA: Diagnosis not present

## 2016-12-17 ENCOUNTER — Telehealth: Payer: Self-pay | Admitting: *Deleted

## 2016-12-17 LAB — PTH, INTACT AND CALCIUM
Calcium: 8.8 mg/dL (ref 8.6–10.4)
PTH: 58 pg/mL (ref 14–64)

## 2016-12-17 NOTE — Telephone Encounter (Signed)
Received Physician Orders from [facility], forwarded to provider/SLS 06/28

## 2016-12-18 ENCOUNTER — Telehealth: Payer: Self-pay | Admitting: Family

## 2016-12-18 NOTE — Telephone Encounter (Signed)
Caller name:Zhuri  Relationship to patient: Can be Steele:  Reason for call:would like to talk about new dx

## 2016-12-21 ENCOUNTER — Other Ambulatory Visit: Payer: Self-pay | Admitting: Family

## 2016-12-21 ENCOUNTER — Ambulatory Visit
Admission: RE | Admit: 2016-12-21 | Discharge: 2016-12-21 | Disposition: A | Discharge status: Home / Self Care | Payer: 59 | Source: Ambulatory Visit | Attending: Family | Admitting: Family

## 2016-12-21 DIAGNOSIS — R921 Mammographic calcification found on diagnostic imaging of breast: Secondary | ICD-10-CM

## 2016-12-21 DIAGNOSIS — R928 Other abnormal and inconclusive findings on diagnostic imaging of breast: Secondary | ICD-10-CM

## 2016-12-21 NOTE — Telephone Encounter (Signed)
Left detailed message on cell# re: below information and to call if any questions.

## 2016-12-21 NOTE — Telephone Encounter (Signed)
Spoke with pt. She is wanting to know the severity of her osteoporosis and how much fosamax and calcium are supposed to help?  Also wants to know about cholesterol result from April? Wanting most recent PTH and vit D result? Please advise?

## 2016-12-21 NOTE — Telephone Encounter (Signed)
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 Fosamax helps to maintain and build bone mass and decreases future risk of fractures.  Her number support starting medication.    PTH and calcium are normal.  Vit D is normal.   Cholesterol was elevated. She should work on low fat/low cholesterol diet and continue regular exercise. She has a very high HDL which is good. I would not recommend cholesterol medication at this time.

## 2016-12-23 LAB — VITAMIN D 1,25 DIHYDROXY
VITAMIN D3 1, 25 (OH): 45 pg/mL
Vitamin D 1, 25 (OH)2 Total: 45 pg/mL (ref 18–72)

## 2017-01-01 ENCOUNTER — Other Ambulatory Visit: Payer: Self-pay | Admitting: *Deleted

## 2017-01-01 MED ORDER — ALENDRONATE SODIUM 70 MG PO TABS: 70.0000 mg | ORAL_TABLET | ORAL | 3 refills | Status: DC

## 2017-01-01 NOTE — Progress Notes (Signed)
Faxed refill request received from [facility] for Alendronate Tablet Refill sent per [facility] refill protocol/SLS 07/13

## 2017-01-02 ENCOUNTER — Encounter (HOSPITAL_BASED_OUTPATIENT_CLINIC_OR_DEPARTMENT_OTHER): Payer: Self-pay | Admitting: *Deleted

## 2017-01-02 ENCOUNTER — Emergency Department (HOSPITAL_BASED_OUTPATIENT_CLINIC_OR_DEPARTMENT_OTHER): Payer: 59

## 2017-01-02 ENCOUNTER — Emergency Department (HOSPITAL_BASED_OUTPATIENT_CLINIC_OR_DEPARTMENT_OTHER)
Admission: EM | Admit: 2017-01-02 | Discharge: 2017-01-02 | Disposition: A | Discharge status: Home / Self Care | Payer: 59 | Attending: Emergency Medicine | Admitting: Emergency Medicine

## 2017-01-02 DIAGNOSIS — Y999 Unspecified external cause status: Secondary | ICD-10-CM | POA: Diagnosis not present

## 2017-01-02 DIAGNOSIS — Z79899 Other long term (current) drug therapy: Secondary | ICD-10-CM | POA: Diagnosis not present

## 2017-01-02 DIAGNOSIS — Y929 Unspecified place or not applicable: Secondary | ICD-10-CM | POA: Diagnosis not present

## 2017-01-02 DIAGNOSIS — S52351A Displaced comminuted fracture of shaft of radius, right arm, initial encounter for closed fracture: Secondary | ICD-10-CM | POA: Insufficient documentation

## 2017-01-02 DIAGNOSIS — Y9355 Activity, bike riding: Secondary | ICD-10-CM | POA: Insufficient documentation

## 2017-01-02 DIAGNOSIS — S6991XA Unspecified injury of right wrist, hand and finger(s), initial encounter: Secondary | ICD-10-CM | POA: Diagnosis present

## 2017-01-02 MED ORDER — HYDROCODONE-ACETAMINOPHEN 5-325 MG PO TABS: 1.0000 | ORAL_TABLET | Freq: Four times a day (QID) | ORAL | 0 refills | Status: DC | PRN

## 2017-01-02 MED ORDER — IBUPROFEN 800 MG PO TABS
800.0000 mg | ORAL_TABLET | Freq: Once | ORAL | Status: AC
  Administered 2017-01-02: 800 mg via ORAL
  Filled 2017-01-02: qty 1

## 2017-01-02 MED ORDER — FENTANYL CITRATE (PF) 100 MCG/2ML IJ SOLN: 12.5000 ug | Freq: Once | INTRAMUSCULAR | Status: DC

## 2017-01-02 NOTE — ED Notes (Signed)
Ice pack given to patient. Patients arm is splinted with arm board and guaze. Deformity noted to wrist,

## 2017-01-02 NOTE — ED Notes (Signed)
ED Provider at bedside. 
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
## 2017-01-02 NOTE — Discharge Instructions (Signed)
Take ibuprofen as needed for pain. You may take Norco as needed for severe pain, but do not drive, drink alcohol, or operate heavy machinery on this medication as it may make you drowsy. Follow-up with Dr. Lenon Curt hand surgeon for reevaluation. Call his office on Monday after 9 AM to set up a follow-up appointment. Return to the ED immediately if you develop any concerning signs or symptoms such as severe pain, pulselessness, pallor to the area, numbness or tingling, fevers or chills.
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
## 2017-10-04 DIAGNOSIS — Z1283 Encounter for screening for malignant neoplasm of skin: Secondary | ICD-10-CM | POA: Diagnosis not present

## 2017-10-04 DIAGNOSIS — Z08 Encounter for follow-up examination after completed treatment for malignant neoplasm: Secondary | ICD-10-CM | POA: Diagnosis not present

## 2017-10-04 DIAGNOSIS — Z85828 Personal history of other malignant neoplasm of skin: Secondary | ICD-10-CM | POA: Diagnosis not present

## 2017-10-12 ENCOUNTER — Other Ambulatory Visit: Payer: Self-pay | Admitting: Family

## 2017-11-11 DIAGNOSIS — N951 Menopausal and female climacteric states: Secondary | ICD-10-CM | POA: Diagnosis not present

## 2017-11-11 DIAGNOSIS — R51 Headache: Secondary | ICD-10-CM | POA: Diagnosis not present

## 2017-11-11 DIAGNOSIS — R5383 Other fatigue: Secondary | ICD-10-CM | POA: Diagnosis not present

## 2018-01-25 ENCOUNTER — Encounter: Payer: Self-pay | Admitting: Family

## 2018-01-25 ENCOUNTER — Ambulatory Visit (INDEPENDENT_AMBULATORY_CARE_PROVIDER_SITE_OTHER): Payer: 59 | Admitting: Family

## 2018-01-25 VITALS — BP 138/79 | HR 59 | Temp 98.1°F | Resp 16 | Ht 65.0 in | Wt 129.8 lb

## 2018-01-25 DIAGNOSIS — M5430 Sciatica, unspecified side: Secondary | ICD-10-CM

## 2018-01-25 DIAGNOSIS — Z0000 Encounter for general adult medical examination without abnormal findings: Secondary | ICD-10-CM | POA: Diagnosis not present

## 2018-01-25 DIAGNOSIS — Z23 Encounter for immunization: Secondary | ICD-10-CM | POA: Diagnosis not present

## 2018-01-25 LAB — CBC WITH DIFFERENTIAL/PLATELET
BASOS ABS: 0 10*3/uL (ref 0.0–0.1)
Basophils Relative: 0.6 % (ref 0.0–3.0)
EOS ABS: 0.1 10*3/uL (ref 0.0–0.7)
EOS PCT: 2.1 % (ref 0.0–5.0)
HCT: 38.3 % (ref 36.0–46.0)
Hemoglobin: 12.8 g/dL (ref 12.0–15.0)
LYMPHS ABS: 2.8 10*3/uL (ref 0.7–4.0)
MCHC: 33.4 g/dL (ref 30.0–36.0)
MCV: 87.7 fl (ref 78.0–100.0)
MONO ABS: 0.5 10*3/uL (ref 0.1–1.0)
Monocytes Relative: 9.4 % (ref 3.0–12.0)
NEUTROS PCT: 31.6 % — AB (ref 43.0–77.0)
Neutro Abs: 1.6 10*3/uL (ref 1.4–7.7)
PLATELETS: 225 10*3/uL (ref 150.0–400.0)
RBC: 4.37 Mil/uL (ref 3.87–5.11)
RDW: 13.6 % (ref 11.5–15.5)
WBC: 4.9 10*3/uL (ref 4.0–10.5)

## 2018-01-25 LAB — HEPATIC FUNCTION PANEL
ALBUMIN: 4.4 g/dL (ref 3.5–5.2)
ALK PHOS: 19 U/L — AB (ref 39–117)
ALT: 13 U/L (ref 0–35)
AST: 17 U/L (ref 0–37)
Bilirubin, Direct: 0.1 mg/dL (ref 0.0–0.3)
Total Bilirubin: 0.7 mg/dL (ref 0.2–1.2)
Total Protein: 7.2 g/dL (ref 6.0–8.3)

## 2018-01-25 LAB — BASIC METABOLIC PANEL
BUN: 13 mg/dL (ref 6–23)
CALCIUM: 9.5 mg/dL (ref 8.4–10.5)
CO2: 29 mEq/L (ref 19–32)
Chloride: 104 mEq/L (ref 96–112)
Creatinine, Ser: 0.76 mg/dL (ref 0.40–1.20)
GFR: 83.15 mL/min (ref >60.00)
GLUCOSE: 93 mg/dL (ref 70–99)
POTASSIUM: 4.8 meq/L (ref 3.5–5.1)
SODIUM: 140 meq/L (ref 135–145)

## 2018-01-25 LAB — LIPID PANEL
CHOLESTEROL: 288 mg/dL — AB (ref 0–200)
HDL: 94.4 mg/dL (ref >39.00)
LDL Cholesterol: 180 mg/dL — ABNORMAL HIGH (ref 0–99)
NONHDL: 193.95
Total CHOL/HDL Ratio: 3
Triglycerides: 70 mg/dL (ref 0.0–149.0)
VLDL: 14 mg/dL (ref 0.0–40.0)

## 2018-01-25 LAB — URINALYSIS, ROUTINE W REFLEX MICROSCOPIC
BILIRUBIN URINE: NEGATIVE
HGB URINE DIPSTICK: NEGATIVE
Ketones, ur: NEGATIVE
LEUKOCYTES UA: NEGATIVE
NITRITE: NEGATIVE
RBC / HPF: NONE SEEN (ref 0–?)
Specific Gravity, Urine: <=1.005 — AB (ref 1.000–1.030)
TOTAL PROTEIN, URINE-UPE24: NEGATIVE
URINE GLUCOSE: NEGATIVE
UROBILINOGEN UA: 0.2 (ref 0.0–1.0)
WBC, UA: NONE SEEN (ref 0–?)
pH: 7 (ref 5.0–8.0)

## 2018-01-25 LAB — TSH: TSH: 1.15 u[IU]/mL (ref 0.35–4.50)

## 2018-01-25 MED ORDER — ALENDRONATE SODIUM 70 MG PO TABS: ORAL_TABLET | ORAL | 3 refills | Status: DC

## 2018-01-25 MED ORDER — MELOXICAM 7.5 MG PO TABS: 7.5000 mg | ORAL_TABLET | Freq: Every day | ORAL | 0 refills | Status: DC

## 2018-01-25 NOTE — Progress Notes (Signed)
Subjective:    Patient ID: Tammy Meyers, female    DOB: [birth year], 57 y.o.   MRN: [id number]  HPI  Patient presents today for complete physical.  Immunizations: tdap 2015 Diet: healthy Exercise:   Colonoscopy: 4.16 Dexa: 6/18 Pap Smear:  11/21/15 Mammogram:  12/21/16 Vision: 8/19 Dental:  Up to date  Notes + fatigue.  Had food allergy testing through   Reports intermittent sciatic pain.  Has been present x 1 year.    Review of Systems  Constitutional: Negative for unexpected weight change.  HENT: Negative for hearing loss and rhinorrhea.   Eyes: Negative for visual disturbance.  Respiratory: Negative for cough and shortness of breath.   Cardiovascular: Negative for chest pain.  Gastrointestinal: Negative for blood in stool, constipation and diarrhea.  Genitourinary: Negative for dysuria, frequency and hematuria.  Musculoskeletal: Negative for arthralgias and myalgias.  Skin: Negative for rash.  Neurological: Negative for headaches.  Hematological: Negative for adenopathy.  Psychiatric/Behavioral:       Denies depression/anxiety       Past Medical History:  Diagnosis Date  . Anemia    hx of  . Elevated cholesterol 2018  . Osteoporosis   . Urinary tract infection      Social History   Socioeconomic History  . Marital status: Single    Spouse name: Not on file  . Number of children: Not on file  . Years of education: Not on file  . Highest education level: Not on file  Occupational History  . Not on file  Social Needs  . Financial resource strain: Not on file  . Food insecurity:    Worry: Not on file    Inability: Not on file  . Transportation needs:    Medical: Not on file    Non-medical: Not on file  Tobacco Use  . Smoking status: Never Smoker  . Smokeless tobacco: Never Used  Substance and Sexual Activity  . Alcohol use: Yes    Alcohol/week: 0.0 oz    Comment: 2 drinks each weekend  . Drug use: No  . Sexual activity: Not on file    Lifestyle  . Physical activity:    Days per week: Not on file    Minutes per session: Not on file  . Stress: Not on file  Relationships  . Social connections:    Talks on phone: Not on file    Gets together: Not on file    Attends religious service: Not on file    Active member of club or organization: Not on file    Attends meetings of clubs or organizations: Not on file    Relationship status: Not on file  . Intimate partner violence:    Fear of current or ex partner: Not on file    Emotionally abused: Not on file    Physically abused: Not on file    Forced sexual activity: Not on file  Other Topics Concern  . Not on file  Social History Narrative   Works for [employer], [place] [employer].    Born in  [place], moved 10 years ago to [place] [place] a lot for work   Recently ended a 5 year relationship.   Single   Parents are both deceased   No children   No pets   Has a 2 brother in [place]   Enjoys rowing, outdor activities (biking, golf)       Past Surgical History:  Procedure Laterality Date  .  ABDOMINAL HERNIA REPAIR  2016  . INGUINAL HERNIA REPAIR  06/07/2012   Procedure: LAPAROSCOPIC INGUINAL HERNIA;  Surgeon: Gayland Curry, MD,FACS;  Location: [place];  Service: General;  Laterality: Left;  . INSERTION OF MESH  06/07/2012   Procedure: INSERTION OF MESH;  Surgeon: Gayland Curry, MD,FACS;  Location: [place];  Service: General;  Laterality: Left;  . KNEE SURGERY  1998   left. "total reconstruction"    Family History  Problem Relation Age of Onset  . Stroke Mother   . Alcoholism Mother   . Dementia Mother   . Stroke Father   . Alcoholism Father   . Dementia Father   . Arthritis Father   . Colon cancer Neg Hx   . Esophageal cancer Neg Hx   . Rectal cancer Neg Hx   . Stomach cancer Neg Hx     No Known Allergies  Current Outpatient Medications on File Prior to Visit  Medication Sig Dispense Refill  . Calcium Carbonate-Vit D-Min (CALCIUM 600+D PLUS  MINERALS) 600-400 MG-UNIT TABS Take by mouth.    . Multiple Vitamin (MULTIVITAMIN) capsule Take 1 capsule by mouth daily.     No current facility-administered medications on file prior to visit.     BP 138/79 (BP Location: Left Arm, Patient Position: Sitting, Cuff Size: Small)   Pulse (!) 59   Temp 98.1 F (36.7 C) (Oral)   Resp 16   Ht 5\' 5"  (1.651 m)   Wt 129 lb 12.8 oz (58.9 kg)   LMP 09/05/2014   SpO2 100%   BMI 21.60 kg/m    Objective:   Physical Exam  Constitutional: She is oriented to person, place, and time. She appears well-developed and well-nourished.  Cardiovascular: Normal rate, regular rhythm and normal heart sounds.  No murmur heard. Pulmonary/Chest: Breath sounds normal. No respiratory distress. She has no wheezes.  Neurological: She is alert and oriented to person, place, and time.  Skin: Skin is warm and dry.  Psychiatric: She has a normal mood and affect. Her behavior is normal. Judgment and thought content normal.          Assessment & Plan:  EKG tracing is personally reviewed.  EKG notes NSR (sinus brady).  No acute changes.   Preventative care- encouraged her to continue to work on healthy diet and exercise. Will obtain routine lab work.  Due for mammo, dexa/colo up to date. Shingrix #1 today.  Sciatica- trial of meloxicam.

## 2018-01-25 NOTE — Patient Instructions (Addendum)
Please complete lab work prior to leaving.  Begin meloxicam once daily for your sciatica. Call us in 2 months to see if we have the Shingrix vaccine in stock and if so, please schedule nurse visit for Shingrix #2.  Follow up in 1 year for a complete physical.

## 2018-01-26 NOTE — Telephone Encounter (Signed)
See mychart message.  Please let pt know that I received message from imaging that she did not complete recommended diagnostic breast imaging/US last year.  Orders are still in the system. She should call the breast center please to schedule. Can you give her the number to call please?  After this is complete we can complete routine screening mammography.

## 2018-02-02 NOTE — Telephone Encounter (Signed)
Attempted to reach pt. Left detailed message on phone that diagnostic work up needs completion then can resume annual screenings. Left # to the Breast Center for pt to call and schedule appt. Also mailed letter to pt.

## 2018-02-11 ENCOUNTER — Other Ambulatory Visit: Payer: Self-pay | Admitting: Family

## 2018-02-11 DIAGNOSIS — R921 Mammographic calcification found on diagnostic imaging of breast: Secondary | ICD-10-CM

## 2018-03-04 ENCOUNTER — Ambulatory Visit
Admission: RE | Admit: 2018-03-04 | Discharge: 2018-03-04 | Disposition: A | Discharge status: Home / Self Care | Payer: 59 | Source: Ambulatory Visit | Attending: Family | Admitting: Family

## 2018-03-04 DIAGNOSIS — R921 Mammographic calcification found on diagnostic imaging of breast: Secondary | ICD-10-CM | POA: Diagnosis not present

## 2018-04-04 DIAGNOSIS — D225 Melanocytic nevi of trunk: Secondary | ICD-10-CM | POA: Diagnosis not present

## 2018-04-04 DIAGNOSIS — Z85820 Personal history of malignant melanoma of skin: Secondary | ICD-10-CM | POA: Diagnosis not present

## 2018-04-04 DIAGNOSIS — D2261 Melanocytic nevi of right upper limb, including shoulder: Secondary | ICD-10-CM | POA: Diagnosis not present

## 2018-04-04 DIAGNOSIS — Z08 Encounter for follow-up examination after completed treatment for malignant neoplasm: Secondary | ICD-10-CM | POA: Diagnosis not present

## 2018-04-04 DIAGNOSIS — D2271 Melanocytic nevi of right lower limb, including hip: Secondary | ICD-10-CM | POA: Diagnosis not present

## 2018-04-04 DIAGNOSIS — D485 Neoplasm of uncertain behavior of skin: Secondary | ICD-10-CM | POA: Diagnosis not present
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
## 2018-04-21 ENCOUNTER — Ambulatory Visit (INDEPENDENT_AMBULATORY_CARE_PROVIDER_SITE_OTHER): Payer: 59

## 2018-04-21 DIAGNOSIS — M533 Sacrococcygeal disorders, not elsewhere classified: Secondary | ICD-10-CM | POA: Diagnosis not present

## 2018-04-21 DIAGNOSIS — Z23 Encounter for immunization: Secondary | ICD-10-CM | POA: Diagnosis not present

## 2018-04-21 DIAGNOSIS — M545 Low back pain: Secondary | ICD-10-CM | POA: Diagnosis not present

## 2018-05-03 ENCOUNTER — Ambulatory Visit: Payer: 59

## 2018-05-09 DIAGNOSIS — M25569 Pain in unspecified knee: Secondary | ICD-10-CM | POA: Diagnosis not present

## 2018-07-06 DIAGNOSIS — R05 Cough: Secondary | ICD-10-CM | POA: Diagnosis not present

## 2018-07-15 DIAGNOSIS — Z0000 Encounter for general adult medical examination without abnormal findings: Secondary | ICD-10-CM | POA: Diagnosis not present

## 2018-07-19 ENCOUNTER — Encounter: Payer: Self-pay | Admitting: Family

## 2018-07-19 ENCOUNTER — Ambulatory Visit (INDEPENDENT_AMBULATORY_CARE_PROVIDER_SITE_OTHER): Payer: 59 | Admitting: Family

## 2018-07-19 VITALS — BP 110/75 | HR 63 | Temp 97.9°F | Resp 16 | Ht 65.0 in | Wt 128.0 lb

## 2018-07-19 DIAGNOSIS — E785 Hyperlipidemia, unspecified: Secondary | ICD-10-CM | POA: Diagnosis not present

## 2018-07-19 DIAGNOSIS — Z23 Encounter for immunization: Secondary | ICD-10-CM

## 2018-07-19 NOTE — Progress Notes (Signed)
Subjective:    Patient ID: Tammy Meyers, female    DOB: Jan 01, 1961, 58 y.o.   MRN: [id number]  HPI   Patient is a 58 yr old female who presents today to discuss hyperlipidemia.She reports that she recently had a biometric screening at work and was told to come see her PCP for further evaluation since her total cholesterol was so high. She reports that she has been eating a low cholesterol diet. Denies any hx of heart disease in either of her parents.   Lab Results  Component Value Date   CHOL 288 (H) 01/25/2018   HDL 94.40 01/25/2018   LDLCALC 180 (H) 01/25/2018   TRIG 70.0 01/25/2018   CHOLHDL 3 01/25/2018       Review of Systems Past Medical History:  Diagnosis Date  . Anemia    hx of  . Elevated cholesterol 2018  . Osteoporosis   . Urinary tract infection      Social History   Socioeconomic History  . Marital status: Single    Spouse name: Not on file  . Number of children: Not on file  . Years of education: Not on file  . Highest education level: Not on file  Occupational History  . Not on file  Social Needs  . Financial resource strain: Not on file  . Food insecurity:    Worry: Not on file    Inability: Not on file  . Transportation needs:    Medical: Not on file    Non-medical: Not on file  Tobacco Use  . Smoking status: Never Smoker  . Smokeless tobacco: Never Used  Substance and Sexual Activity  . Alcohol use: Yes    Alcohol/week: 0.0 standard drinks    Comment: 2 drinks each weekend  . Drug use: No  . Sexual activity: Not on file  Lifestyle  . Physical activity:    Days per week: Not on file    Minutes per session: Not on file  . Stress: Not on file  Relationships  . Social connections:    Talks on phone: Not on file    Gets together: Not on file    Attends religious service: Not on file    Active member of club or organization: Not on file    Attends meetings of clubs or organizations: Not on file    Relationship status: Not on  file  . Intimate partner violence:    Fear of current or ex partner: Not on file    Emotionally abused: Not on file    Physically abused: Not on file    Forced sexual activity: Not on file  Other Topics Concern  . Not on file  Social History Narrative   Works for [employer].    Born in  [place], moved 10 years ago to [place] a lot for work   Recently ended a 5 year relationship.   Single   Parents are both deceased   No children   No pets   Has a 2 brother in [place]   Enjoys rowing, outdor activities (biking, golf)       Past Surgical History:  Procedure Laterality Date  . ABDOMINAL HERNIA REPAIR  2016  . INGUINAL HERNIA REPAIR  06/07/2012   Procedure: LAPAROSCOPIC INGUINAL HERNIA;  Surgeon: Gayland Curry, MD,FACS;  Location: [facility];  Service: General;  Laterality: Left;  . INSERTION OF MESH  06/07/2012   Procedure: INSERTION OF MESH;  Surgeon: Gayland Curry, MD,FACS;  Location: [facility];  Service: General;  Laterality: Left;  . KNEE SURGERY  1998   left. "total reconstruction"    Family History  Problem Relation Age of Onset  . Stroke Mother   . Alcoholism Mother   . Dementia Mother   . Stroke Father        tia  . Alcoholism Father   . Dementia Father   . Arthritis Father   . Colon cancer Neg Hx   . Esophageal cancer Neg Hx   . Rectal cancer Neg Hx   . Stomach cancer Neg Hx     No Known Allergies  Current Outpatient Medications on File Prior to Visit  Medication Sig Dispense Refill  . alendronate (FOSAMAX) 70 MG tablet TAKE 1 TABLET BY MOUTH  EVERY 7 DAYS WITH A FULL  GLASS OF WATER ON AN EMPTY  STOMACH 12 tablet 3  . Calcium Carbonate-Vit D-Min (CALCIUM 600+D PLUS MINERALS) 600-400 MG-UNIT TABS Take by mouth.    . meloxicam (MOBIC) 7.5 MG tablet Take 1 tablet (7.5 mg total) by mouth daily. 14 tablet 0  . Multiple Vitamin (MULTIVITAMIN) capsule Take 1 capsule by mouth daily.     No current facility-administered medications on  file prior to visit.     BP 110/75 (BP Location: Left Arm, Patient Position: Sitting, Cuff Size: Small)   Pulse 63   Temp 97.9 F (36.6 C) (Oral)   Resp 16   Ht 5\' 5"  (1.651 m)   Wt 128 lb (58.1 kg)   LMP 09/05/2014   SpO2 100%   BMI 21.30 kg/m       Objective:   Physical Exam Vitals signs reviewed.  Constitutional:      Appearance: Normal appearance. She is not ill-appearing.  Neurological:     Mental Status: She is alert and oriented to person, place, and time.  Psychiatric:        Mood and Affect: Mood normal.        Behavior: Behavior normal.           Assessment & Plan:  Hyperlipidemia-  I plugged her data into the CV risk calculator and her CV risk is 1.8 %. Her HDL is 105 which is cardioprotective. Does not reach recommended threshold for statin therapy. Recommended that she continue healthy low fat diet and regular exercise.

## 2018-07-19 NOTE — Patient Instructions (Signed)
Please continue low fat/low cholesterol diet and exercise.

## 2019-01-30 ENCOUNTER — Encounter: Payer: 59 | Admitting: Family

## 2022-10-08 ENCOUNTER — Encounter: Payer: Self-pay | Admitting: Ophthalmology

## 2022-10-12 NOTE — Discharge Instructions (Signed)

## 2022-10-14 ENCOUNTER — Ambulatory Visit: Payer: BC Managed Care – PPO | Admitting: Anesthesiology

## 2022-10-14 ENCOUNTER — Other Ambulatory Visit: Payer: Self-pay

## 2022-10-14 ENCOUNTER — Encounter: Payer: Self-pay | Admitting: Ophthalmology

## 2022-10-14 ENCOUNTER — Encounter
Admission: RE | Disposition: A | Discharge status: Home / Self Care | Payer: Self-pay | Source: Home / Self Care | Attending: Ophthalmology

## 2022-10-14 ENCOUNTER — Ambulatory Visit
Admission: RE | Admit: 2022-10-14 | Discharge: 2022-10-14 | Disposition: A | Discharge status: Home / Self Care | Payer: BC Managed Care – PPO | Attending: Ophthalmology | Admitting: Ophthalmology

## 2022-10-14 DIAGNOSIS — N189 Chronic kidney disease, unspecified: Secondary | ICD-10-CM | POA: Diagnosis not present

## 2022-10-14 DIAGNOSIS — H2512 Age-related nuclear cataract, left eye: Secondary | ICD-10-CM | POA: Diagnosis not present

## 2022-10-14 DIAGNOSIS — D759 Disease of blood and blood-forming organs, unspecified: Secondary | ICD-10-CM | POA: Diagnosis not present

## 2022-10-14 DIAGNOSIS — D649 Anemia, unspecified: Secondary | ICD-10-CM | POA: Insufficient documentation

## 2022-10-14 HISTORY — DX: Hyperlipidemia, unspecified: E78.5

## 2022-10-14 HISTORY — DX: Unspecified osteoarthritis, unspecified site: M19.90

## 2022-10-14 HISTORY — DX: Chronic kidney disease, unspecified: N18.9

## 2022-10-14 HISTORY — PX: CATARACT EXTRACTION W/PHACO: SHX586

## 2022-10-14 SURGERY — PHACOEMULSIFICATION, CATARACT, WITH IOL INSERTION
Anesthesia: Monitor Anesthesia Care | Site: Eye | Laterality: Left

## 2022-10-14 MED ORDER — SIGHTPATH DOSE#1 NA HYALUR & NA CHOND-NA HYALUR IO KIT
PACK | INTRAOCULAR | Status: DC | PRN
  Administered 2022-10-14: 1 via OPHTHALMIC

## 2022-10-14 MED ORDER — BRIMONIDINE TARTRATE-TIMOLOL 0.2-0.5 % OP SOLN
OPHTHALMIC | Status: DC | PRN
  Administered 2022-10-14: 1 [drp] via OPHTHALMIC

## 2022-10-14 MED ORDER — ARMC OPHTHALMIC DILATING DROPS
1.0000 | OPHTHALMIC | Status: DC | PRN
  Administered 2022-10-14 (×3): 1 via OPHTHALMIC

## 2022-10-14 MED ORDER — FENTANYL CITRATE (PF) 100 MCG/2ML IJ SOLN
INTRAMUSCULAR | Status: DC | PRN
  Administered 2022-10-14: 50 ug via INTRAVENOUS

## 2022-10-14 MED ORDER — TETRACAINE HCL 0.5 % OP SOLN
1.0000 [drp] | OPHTHALMIC | Status: DC | PRN
  Administered 2022-10-14 (×3): 1 [drp] via OPHTHALMIC

## 2022-10-14 MED ORDER — SIGHTPATH DOSE#1 BSS IO SOLN
INTRAOCULAR | Status: DC | PRN
  Administered 2022-10-14: 85 mL via OPHTHALMIC

## 2022-10-14 MED ORDER — MIDAZOLAM HCL 2 MG/2ML IJ SOLN
INTRAMUSCULAR | Status: DC | PRN
  Administered 2022-10-14 (×2): 1 mg via INTRAVENOUS

## 2022-10-14 MED ORDER — LACTATED RINGERS IV SOLN: INTRAVENOUS | Status: DC

## 2022-10-14 MED ORDER — SIGHTPATH DOSE#1 BSS IO SOLN
INTRAOCULAR | Status: DC | PRN
  Administered 2022-10-14: 1 mL

## 2022-10-14 MED ORDER — SIGHTPATH DOSE#1 BSS IO SOLN
INTRAOCULAR | Status: DC | PRN
  Administered 2022-10-14: 15 mL

## 2022-10-14 MED ORDER — CEFUROXIME OPHTHALMIC INJECTION 1 MG/0.1 ML
INJECTION | OPHTHALMIC | Status: DC | PRN
  Administered 2022-10-14: .1 mL via INTRACAMERAL

## 2022-10-14 SURGICAL SUPPLY — 20 items
CANNULA ANT/CHMB 27G (MISCELLANEOUS) IMPLANT
CANNULA ANT/CHMB 27GA (MISCELLANEOUS) IMPLANT
CATARACT SUITE SIGHTPATH (MISCELLANEOUS) ×1 IMPLANT
FEE CATARACT SUITE SIGHTPATH (MISCELLANEOUS) ×1 IMPLANT
GLOVE SRG 8 PF TXTR STRL LF DI (GLOVE) ×1 IMPLANT
GLOVE SURG ENC TEXT LTX SZ7.5 (GLOVE) ×1 IMPLANT
GLOVE SURG GAMMEX PI TX LF 7.5 (GLOVE) IMPLANT
GLOVE SURG UNDER POLY LF SZ8 (GLOVE) ×1
LENS CLAREON VIVITY 22.0 ×1 IMPLANT
LENS IOL CLRN VT YLW 22.0 IMPLANT
NDL FILTER BLUNT 18X1 1/2 (NEEDLE) ×1 IMPLANT
NDL RETROBULBAR .5 NSTRL (NEEDLE) IMPLANT
NEEDLE FILTER BLUNT 18X1 1/2 (NEEDLE) ×1 IMPLANT
PACK VIT ANT 23G (MISCELLANEOUS) IMPLANT
RING MALYGIN 7.0 (MISCELLANEOUS) IMPLANT
SUT ETHILON 10-0 CS-B-6CS-B-6 (SUTURE)
SUT VICRYL  9 0 (SUTURE)
SUT VICRYL 9 0 (SUTURE) IMPLANT
SUTURE EHLN 10-0 CS-B-6CS-B-6 (SUTURE) IMPLANT
SYR 3ML LL SCALE MARK (SYRINGE) ×1 IMPLANT

## 2022-10-14 NOTE — Op Note (Signed)
OPERATIVE NOTE  Tammy Meyers [id number] 10/14/2022   PREOPERATIVE DIAGNOSIS:  Nuclear sclerotic cataract left eye. H25.12   POSTOPERATIVE DIAGNOSIS:    Nuclear sclerotic cataract left eye.     PROCEDURE:  Phacoemusification with posterior chamber intraocular lens placement of the left eye  Ultrasound time: Procedure(s) with comments: CATARACT EXTRACTION PHACO AND INTRAOCULAR LENS PLACEMENT (IOC) LEFT CLAREON VIVITY (Left) - 7.18 1:00.6  LENS:   Implant Name Type Inv. Item Serial No. Manufacturer Lot No. LRB No. Used Action  LENS CLAREON VIVITY 22.0 - [id number]  LENS CLAREON VIVITY 22.0 [id number] SIGHTPATH  Left 1 Implanted      SURGEON:  Deirdre Evener, MD   ANESTHESIA:  Topical with tetracaine drops and 2% Xylocaine jelly, augmented with 1% preservative-free intracameral lidocaine.    COMPLICATIONS:  None.   DESCRIPTION OF PROCEDURE:  The patient was identified in the holding room and transported to the operating room and placed in the supine position under the operating microscope.  The left eye was identified as the operative eye and it was prepped and draped in the usual sterile ophthalmic fashion.   A 1 millimeter clear-corneal paracentesis was made at the 1:30 position.  0.5 ml of preservative-free 1% lidocaine was injected into the anterior chamber.  The anterior chamber was filled with Viscoat viscoelastic.  A 2.4 millimeter keratome was used to make a near-clear corneal incision at the 10:30 position.  .  A curvilinear capsulorrhexis was made with a cystotome and capsulorrhexis forceps.  Balanced salt solution was used to hydrodissect and hydrodelineate the nucleus.   Phacoemulsification was then used in stop and chop fashion to remove the lens nucleus and epinucleus.  The remaining cortex was then removed using the irrigation and aspiration handpiece. Provisc was then placed into the capsular bag to distend it for lens placement.  A lens was then injected  into the capsular bag.  The remaining viscoelastic was aspirated.   Wounds were hydrated with balanced salt solution.  The anterior chamber was inflated to a physiologic pressure with balanced salt solution.  No wound leaks were noted. Cefuroxime 0.1 ml of a /ml solution was injected into the anterior chamber for a dose of 1 mg of intracameral antibiotic at the completion of the case.   Timolol and Brimonidine drops were applied to the eye.  The patient was taken to the recovery room in stable condition without complications of anesthesia or surgery.  Tammy Meyers 10/14/2022, 8:21 AM\

## 2022-10-14 NOTE — Anesthesia Preprocedure Evaluation (Signed)
Anesthesia Evaluation  Patient identified by MRN, date of birth, ID band Patient awake    Reviewed: Allergy & Precautions, H&P , NPO status , Patient's Chart, lab work & pertinent test results  Airway Mallampati: III  TM Distance: <3 FB Neck ROM: Full    Dental no notable dental hx.    Pulmonary neg pulmonary ROS   Pulmonary exam normal breath sounds clear to auscultation       Cardiovascular negative cardio ROS Normal cardiovascular exam Rhythm:Regular Rate:Normal     Neuro/Psych negative neurological ROS  negative psych ROS   GI/Hepatic negative GI ROS, Neg liver ROS,,,  Endo/Other  negative endocrine ROS    Renal/GU Renal diseasenegative Renal ROS  negative genitourinary   Musculoskeletal negative musculoskeletal ROS (+) Arthritis ,  Osteoporosis    Abdominal   Peds negative pediatric ROS (+)  Hematology negative hematology ROS (+) Blood dyscrasia, anemia   Anesthesia Other Findings   Reproductive/Obstetrics negative OB ROS                             Anesthesia Physical Anesthesia Plan  ASA: 2  Anesthesia Plan: MAC   Post-op Pain Management:    Induction: Intravenous  PONV Risk Score and Plan:   Airway Management Planned: Natural Airway and Nasal Cannula  Additional Equipment:   Intra-op Plan:   Post-operative Plan:   Informed Consent: I have reviewed the patients History and Physical, chart, labs and discussed the procedure including the risks, benefits and alternatives for the proposed anesthesia with the patient or authorized representative who has indicated his/her understanding and acceptance.     Dental Advisory Given  Plan Discussed with: Anesthesiologist, CRNA and Surgeon  Anesthesia Plan Comments: (Patient consented for risks of anesthesia including but not limited to:  - adverse reactions to medications - damage to eyes, teeth, lips or other oral  mucosa - nerve damage due to positioning  - sore throat or hoarseness - Damage to heart, brain, nerves, lungs, other parts of body or loss of life  Patient voiced understanding.)       Anesthesia Quick Evaluation

## 2022-10-14 NOTE — Anesthesia Postprocedure Evaluation (Signed)
Anesthesia Post Note  Patient: Tammy Meyers  Procedure(s) Performed: CATARACT EXTRACTION PHACO AND INTRAOCULAR LENS PLACEMENT (IOC) LEFT CLAREON VIVITY (Left: Eye)  Patient location during evaluation: PACU Anesthesia Type: MAC Level of consciousness: awake and alert Pain management: pain level controlled Vital Signs Assessment: post-procedure vital signs reviewed and stable Respiratory status: spontaneous breathing, nonlabored ventilation, respiratory function stable and patient connected to nasal cannula oxygen Cardiovascular status: stable and blood pressure returned to baseline Postop Assessment: no apparent nausea or vomiting Anesthetic complications: no   No notable events documented.   Last Vitals:  Vitals:   10/14/22 0820 10/14/22 0825  BP: 111/79 103/76  Pulse: (!) 56 (!) 57  Resp: 18 10  Temp: 36.6 C (!) 36.4 C  SpO2: 97% 96%    Last Pain:  Vitals:   10/14/22 0825  TempSrc:   PainSc: 0-No pain                 Quinlan Mcfall C Molley Houser

## 2022-10-14 NOTE — H&P (Signed)
[facility]   Primary Care Physician:  Cherylann Ratel, MD Ophthalmologist: Dr. Lockie Mola  Pre-Procedure History & Physical: HPI:  Tammy Meyers is a 62 y.o. female here for ophthalmic surgery.   Past Medical History:  Diagnosis Date   Anemia    hx of   Arthritis    knee   Chronic renal disease    Elevated cholesterol 2018   Hyperlipidemia    Osteoporosis    Urinary tract infection     Past Surgical History:  Procedure Laterality Date   ABDOMINAL HERNIA REPAIR  2016   INGUINAL HERNIA REPAIR  06/07/2012   Procedure: LAPAROSCOPIC INGUINAL HERNIA;  Surgeon: Atilano Ina, MD,FACS;  Location: [facility];  Service: General;  Laterality: Left;   INSERTION OF MESH  06/07/2012   Procedure: INSERTION OF MESH;  Surgeon: Atilano Ina, MD,FACS;  Location: [facility];  Service: General;  Laterality: Left;   KNEE SURGERY  1998   left. "total reconstruction"    Prior to Admission medications   Medication Sig Start Date End Date Taking? Authorizing Provider  Calcium Carbonate-Vit D-Min (CALCIUM 600+D PLUS MINERALS) 600-400 MG-UNIT TABS Take by mouth. 12/15/16  Yes [provider]  ferrous sulfate 324 MG TBEC Take 324 mg by mouth daily with breakfast.   Yes [provider]  Magnesium 500 MG TABS Take by mouth daily.   Yes [provider]  Multiple Vitamin (MULTIVITAMIN) capsule Take 1 capsule by mouth daily.   Yes [provider]  psyllium (METAMUCIL) 58.6 % powder Take 1 packet by mouth daily as needed.   Yes [provider]  rosuvastatin (CRESTOR) 10 MG tablet Take 10 mg by mouth daily.   Yes [provider]    Allergies as of 08/12/2022   (No Known Allergies)    Family History  Problem Relation Age of Onset   Stroke Mother    Alcoholism Mother    Dementia Mother    Stroke Father        tia   Alcoholism Father    Dementia Father    Arthritis Father    Colon cancer Neg Hx    Esophageal cancer Neg Hx     Rectal cancer Neg Hx    Stomach cancer Neg Hx     Social History   Socioeconomic History   Marital status: Married    Spouse name: Not on file   Number of children: Not on file   Years of education: Not on file   Highest education level: Not on file  Occupational History   Not on file  Tobacco Use   Smoking status: Never   Smokeless tobacco: Never  Vaping Use   Vaping Use: Never used  Substance and Sexual Activity   Alcohol use: Yes    Alcohol/week: 0.0 standard drinks of alcohol    Comment: 2 drinks each weekend   Drug use: No   Sexual activity: Not on file  Other Topics Concern   Not on file  Social History Narrative   Works for [employer].    Born in  [place], moved 10 years ago to [place] a lot for work   Recently ended a 5 year relationship.   Single   Parents are both deceased   No children   No pets   Has a 2 brother in [place]   Enjoys rowing, outdor activities (biking, golf)      Social Determinants of Health  Financial Resource Strain: Not on file  Food Insecurity: Not on file  Transportation Needs: Not on file  Physical Activity: Not on file  Stress: Not on file  Social Connections: Not on file  Intimate Partner Violence: Not on file    Review of Systems: See HPI, otherwise negative ROS  Physical Exam: BP 118/80   Pulse 63   Temp 98 F (36.7 C) (Temporal)   Ht  (1.626 m)   Wt 58.5 kg   LMP 06/22/2014   SpO2 100%   BMI 22.14 kg/m  General:   Alert,  pleasant and cooperative in NAD Head:  Normocephalic and atraumatic. Lungs:  Clear to auscultation.    Heart:  Regular rate and rhythm.   Impression/Plan: Tammy Meyers is here for ophthalmic surgery.  Risks, benefits, limitations, and alternatives regarding ophthalmic surgery have been reviewed with the patient.  Questions have been answered.  All parties agreeable.   Lockie Mola, MD  10/14/2022, 7:28 AM

## 2022-10-14 NOTE — Transfer of Care (Signed)
Immediate Anesthesia Transfer of Care Note  Patient: Tammy Meyers  Procedure(s) Performed: CATARACT EXTRACTION PHACO AND INTRAOCULAR LENS PLACEMENT (IOC) LEFT CLAREON VIVITY (Left: Eye)  Patient Location: PACU  Anesthesia Type: MAC  Level of Consciousness: awake, alert  and patient cooperative  Airway and Oxygen Therapy: Patient Spontanous Breathing and Patient connected to supplemental oxygen  Post-op Assessment: Post-op Vital signs reviewed, Patient's Cardiovascular Status Stable, Respiratory Function Stable, Patent Airway and No signs of Nausea or vomiting  Post-op Vital Signs: Reviewed and stable  Complications: No notable events documented.

## 2022-10-22 NOTE — Anesthesia Preprocedure Evaluation (Addendum)
Anesthesia Evaluation  Patient identified by MRN, date of birth, ID band Patient awake    Reviewed: Allergy & Precautions, H&P , NPO status , Patient's Chart, lab work & pertinent test results  Airway Mallampati: II  TM Distance: >3 FB Neck ROM: Full    Dental no notable dental hx.    Pulmonary neg pulmonary ROS   Pulmonary exam normal breath sounds clear to auscultation       Cardiovascular negative cardio ROS Normal cardiovascular exam Rhythm:Regular Rate:Normal     Neuro/Psych negative neurological ROS  negative psych ROS   GI/Hepatic negative GI ROS, Neg liver ROS,,,  Endo/Other  negative endocrine ROS    Renal/GU negative Renal ROS  negative genitourinary   Musculoskeletal negative musculoskeletal ROS (+)    Abdominal   Peds negative pediatric ROS (+)  Hematology negative hematology ROS (+)   Anesthesia Other Findings Urinary tract infection  Anemia Elevated cholesterol  Osteoporosis Arthritis  Chronic renal disease Hyperlipidemia     Reproductive/Obstetrics negative OB ROS                             Anesthesia Physical Anesthesia Plan  ASA: 2  Anesthesia Plan: MAC   Post-op Pain Management:    Induction: Intravenous  PONV Risk Score and Plan:   Airway Management Planned: Natural Airway and Nasal Cannula  Additional Equipment:   Intra-op Plan:   Post-operative Plan:   Informed Consent: I have reviewed the patients History and Physical, chart, labs and discussed the procedure including the risks, benefits and alternatives for the proposed anesthesia with the patient or authorized representative who has indicated his/her understanding and acceptance.     Dental Advisory Given  Plan Discussed with: Anesthesiologist, CRNA and Surgeon  Anesthesia Plan Comments: (Patient consented for risks of anesthesia including but not limited to:  - adverse reactions to  medications - damage to eyes, teeth, lips or other oral mucosa - nerve damage due to positioning  - sore throat or hoarseness - Damage to heart, brain, nerves, lungs, other parts of body or loss of life  Patient voiced understanding.)       Anesthesia Quick Evaluation

## 2022-10-29 ENCOUNTER — Other Ambulatory Visit: Payer: Self-pay

## 2022-10-29 ENCOUNTER — Encounter: Payer: Self-pay | Admitting: Ophthalmology

## 2022-11-04 ENCOUNTER — Other Ambulatory Visit: Payer: Self-pay

## 2022-11-04 ENCOUNTER — Encounter
Admission: RE | Disposition: A | Discharge status: Home / Self Care | Payer: Self-pay | Source: Home / Self Care | Attending: Ophthalmology

## 2022-11-04 ENCOUNTER — Ambulatory Visit: Payer: BC Managed Care – PPO | Admitting: Anesthesiology

## 2022-11-04 ENCOUNTER — Ambulatory Visit
Admission: RE | Admit: 2022-11-04 | Discharge: 2022-11-04 | Disposition: A | Discharge status: Home / Self Care | Payer: BC Managed Care – PPO | Attending: Ophthalmology | Admitting: Ophthalmology

## 2022-11-04 ENCOUNTER — Encounter: Payer: Self-pay | Admitting: Ophthalmology

## 2022-11-04 DIAGNOSIS — N189 Chronic kidney disease, unspecified: Secondary | ICD-10-CM | POA: Diagnosis not present

## 2022-11-04 DIAGNOSIS — H2511 Age-related nuclear cataract, right eye: Secondary | ICD-10-CM | POA: Insufficient documentation

## 2022-11-04 DIAGNOSIS — E7800 Pure hypercholesterolemia, unspecified: Secondary | ICD-10-CM | POA: Diagnosis not present

## 2022-11-04 DIAGNOSIS — D649 Anemia, unspecified: Secondary | ICD-10-CM | POA: Insufficient documentation

## 2022-11-04 HISTORY — PX: CATARACT EXTRACTION W/PHACO: SHX586

## 2022-11-04 SURGERY — PHACOEMULSIFICATION, CATARACT, WITH IOL INSERTION
Anesthesia: Monitor Anesthesia Care | Site: Eye | Laterality: Right

## 2022-11-04 MED ORDER — FENTANYL CITRATE (PF) 100 MCG/2ML IJ SOLN
INTRAMUSCULAR | Status: DC | PRN
  Administered 2022-11-04 (×2): 50 ug via INTRAVENOUS

## 2022-11-04 MED ORDER — BRIMONIDINE TARTRATE-TIMOLOL 0.2-0.5 % OP SOLN
OPHTHALMIC | Status: DC | PRN
  Administered 2022-11-04: 1 [drp] via OPHTHALMIC

## 2022-11-04 MED ORDER — ARMC OPHTHALMIC DILATING DROPS
1.0000 | OPHTHALMIC | Status: DC | PRN
  Administered 2022-11-04 (×3): 1 via OPHTHALMIC

## 2022-11-04 MED ORDER — SIGHTPATH DOSE#1 BSS IO SOLN
INTRAOCULAR | Status: DC | PRN
  Administered 2022-11-04: 15 mL via INTRAOCULAR

## 2022-11-04 MED ORDER — CEFUROXIME OPHTHALMIC INJECTION 1 MG/0.1 ML
INJECTION | OPHTHALMIC | Status: DC | PRN
  Administered 2022-11-04: .1 mL via INTRACAMERAL

## 2022-11-04 MED ORDER — SIGHTPATH DOSE#1 BSS IO SOLN
INTRAOCULAR | Status: DC | PRN
  Administered 2022-11-04: 2 mL

## 2022-11-04 MED ORDER — SIGHTPATH DOSE#1 NA HYALUR & NA CHOND-NA HYALUR IO KIT
PACK | INTRAOCULAR | Status: DC | PRN
  Administered 2022-11-04: 1 via OPHTHALMIC

## 2022-11-04 MED ORDER — MIDAZOLAM HCL 2 MG/2ML IJ SOLN
INTRAMUSCULAR | Status: DC | PRN
  Administered 2022-11-04: 2 mg via INTRAVENOUS

## 2022-11-04 MED ORDER — SIGHTPATH DOSE#1 BSS IO SOLN
INTRAOCULAR | Status: DC | PRN
  Administered 2022-11-04: 51 mL via OPHTHALMIC

## 2022-11-04 MED ORDER — LACTATED RINGERS IV SOLN: INTRAVENOUS | Status: DC

## 2022-11-04 MED ORDER — TETRACAINE HCL 0.5 % OP SOLN
1.0000 [drp] | OPHTHALMIC | Status: DC | PRN
  Administered 2022-11-04 (×3): 1 [drp] via OPHTHALMIC

## 2022-11-04 SURGICAL SUPPLY — 21 items
CANNULA ANT/CHMB 27G (MISCELLANEOUS) IMPLANT
CANNULA ANT/CHMB 27GA (MISCELLANEOUS) IMPLANT
CATARACT SUITE SIGHTPATH (MISCELLANEOUS) ×1 IMPLANT
FEE CATARACT SUITE SIGHTPATH (MISCELLANEOUS) ×1 IMPLANT
GLOVE SRG 8 PF TXTR STRL LF DI (GLOVE) ×1 IMPLANT
GLOVE SURG ENC TEXT LTX SZ7.5 (GLOVE) ×1 IMPLANT
GLOVE SURG GAMMEX PI TX LF 7.5 (GLOVE) IMPLANT
GLOVE SURG UNDER POLY LF SZ8 (GLOVE) ×1
LENS CLAREON VIVITY TORIC 20.5 ×1 IMPLANT
LENS CLRN VIVITY TORIC 3 20.5 ×1 IMPLANT
LENS IOL CLRN VT TRC 3 20.5 IMPLANT
NDL FILTER BLUNT 18X1 1/2 (NEEDLE) ×1 IMPLANT
NDL RETROBULBAR .5 NSTRL (NEEDLE) IMPLANT
NEEDLE FILTER BLUNT 18X1 1/2 (NEEDLE) ×1 IMPLANT
PACK VIT ANT 23G (MISCELLANEOUS) IMPLANT
RING MALYGIN 7.0 (MISCELLANEOUS) IMPLANT
SUT ETHILON 10-0 CS-B-6CS-B-6 (SUTURE)
SUT VICRYL  9 0 (SUTURE)
SUT VICRYL 9 0 (SUTURE) IMPLANT
SUTURE EHLN 10-0 CS-B-6CS-B-6 (SUTURE) IMPLANT
SYR 3ML LL SCALE MARK (SYRINGE) ×1 IMPLANT

## 2022-11-04 NOTE — Op Note (Signed)
LOCATION:  [facility]   PREOPERATIVE DIAGNOSIS:  Nuclear sclerotic cataract of the right eye.  H25.11   POSTOPERATIVE DIAGNOSIS:  Nuclear sclerotic cataract of the right eye.   PROCEDURE:  Phacoemulsification with Toric posterior chamber intraocular lens placement of the right eye.  Ultrasound time: Procedure(s): CATARACT EXTRACTION PHACO AND INTRAOCULAR LENS PLACEMENT (IOC) RIGHT CLAREON VIVITY TORIC 5.81 00:43.9 (Right)  LENS:   Implant Name Type Inv. Item Serial No. Manufacturer Lot No. LRB No. Used Action  LENS CLAREON VIVITY TORIC 20.5 - [id number]  LENS CLAREON VIVITY TORIC 20.5 [id number] [employer]IGHTPATH  Right 1 Implanted     [id number] Toric intraocular lens with 1.5 diopters of cylindrical power with axis orientation at 138 degrees.   SURGEON:  Deirdre Evener, MD   ANESTHESIA: Topical with tetracaine drops and 2% Xylocaine jelly, augmented with 1% preservative-free intracameral lidocaine. .   COMPLICATIONS:  None.   DESCRIPTION OF PROCEDURE:  The patient was identified in the holding room and transported to the operating suite and placed in the supine position under the operating microscope.  The right eye was identified as the operative eye, and it was prepped and draped in the usual sterile ophthalmic fashion.    A clear-corneal paracentesis incision was made at the 12:00 position.  0.5 ml of preservative-free 1% lidocaine was injected into the anterior chamber. The anterior chamber was filled with Viscoat.  A 2.4 millimeter near clear corneal incision was then made at the 9:00 position.  A cystotome and capsulorrhexis forceps were then used to make a curvilinear capsulorrhexis.  Hydrodissection and hydrodelineation were then performed using balanced salt solution.   Phacoemulsification was then used in stop and chop fashion to remove the lens, nucleus and epinucleus.  The remaining cortex was aspirated using the irrigation and aspiration handpiece.  Provisc  viscoelastic was then placed into the capsular bag to distend it for lens placement.  The Verion digital marker was used to align the implant at the intended axis.   A Toric lens was then injected into the capsular bag.  It was rotated clockwise until the axis marks on the lens were approximately 15 degrees in the counterclockwise direction to the intended alignment.  The viscoelastic was aspirated from the eye using the irrigation aspiration handpiece.  Then, a Koch spatula through the sideport incision was used to rotate the lens in a clockwise direction until the axis markings of the intraocular lens were lined up with the Verion alignment.  Balanced salt solution was then used to hydrate the wounds. Cefuroxime 0.1 ml of a 10mg /ml solution was injected into the anterior chamber for a dose of 1 mg of intracameral antibiotic at the completion of the case.    The eye was noted to have a physiologic pressure and there was no wound leak noted.   Timolol and Brimonidine drops were applied to the eye.  The patient was taken to the recovery room in stable condition having had no complications of anesthesia or surgery.  Kenly Henckel 11/04/2022, 8:48 AM

## 2022-11-04 NOTE — Discharge Instructions (Signed)

## 2022-11-04 NOTE — H&P (Signed)
[facility]   Primary Care Physician:  Cherylann Ratel, MD Ophthalmologist: Dr. Lockie Mola  Pre-Procedure History & Physical: HPI:  [facility]ammy [facility]ers is a 62 y.o. female here for ophthalmic surgery.   Past Medical History:  Diagnosis Date   Anemia    hx of   Arthritis    knee   Chronic renal disease    Elevated cholesterol 2018   Hyperlipidemia    Osteoporosis    Urinary tract infection     Past Surgical History:  Procedure Laterality Date   ABDOMINAL HERNIA REPAIR  2016   CATARACT EXTRACTION W/PHACO Left 10/14/2022   Procedure: CATARACT EXTRACTION PHACO AND INTRAOCULAR LENS PLACEMENT (IOC) LEFT [facility];  Surgeon: Lockie Mola, MD;  Location: [facility];  Service: Ophthalmology;  Laterality: Left;  7.18 1:00.6   INGUINAL HERNIA REPAIR  06/07/2012   Procedure: LAPAROSCOPIC INGUINAL HERNIA;  Surgeon: Atilano Ina, MD,FACS;  Location: [facility];  Service: General;  Laterality: Left;   INSERTION OF MESH  06/07/2012   Procedure: INSERTION OF MESH;  Surgeon: Atilano Ina, MD,FACS;  Location: [facility];  Service: General;  Laterality: Left;   KNEE SURGERY  1998   left. "total reconstruction"    Prior to Admission medications   Medication Sig Start Date End Date Taking? Authorizing Provider  Calcium Carbonate-Vit D-Min (CALCIUM 600+D PLUS MINERALS) 600-400 MG-UNIT TABS Take by mouth. 12/15/16  Yes [provider]  ferrous sulfate 324 MG TBEC Take 324 mg by mouth daily with breakfast.   Yes [provider]  Magnesium 500 MG TABS Take by mouth daily.   Yes [provider]  Multiple Vitamin (MULTIVITAMIN) capsule Take 1 capsule by mouth daily.   Yes [provider]  psyllium (METAMUCIL) 58.6 % powder Take 1 packet by mouth daily as needed.   Yes [provider]  rosuvastatin (CRESTOR) 10 MG tablet Take 10 mg by mouth daily.   Yes [provider]    Allergies as of 08/12/2022   (No Known  Allergies)    Family History  Problem Relation Age of Onset   Stroke Mother    Alcoholism Mother    Dementia Mother    Stroke Father        tia   Alcoholism Father    Dementia Father    Arthritis Father    Colon cancer Neg Hx    Esophageal cancer Neg Hx    Rectal cancer Neg Hx    Stomach cancer Neg Hx     Social History   Socioeconomic History   Marital status: Married    Spouse name: Not on file   Number of children: Not on file   Years of education: Not on file   Highest education level: Not on file  Occupational History   Not on file  Tobacco Use   Smoking status: Never   Smokeless tobacco: Never  Vaping Use   Vaping Use: Never used  Substance and Sexual Activity   Alcohol use: Yes    Alcohol/week: 0.0 standard drinks of alcohol    Comment: 2 drinks each weekend   Drug use: No   Sexual activity: Not on file  Other Topics Concern   Not on file  Social History Narrative   Works for [employer], Runs [employer].    Born in  [place], moved 10 years ago to [place] a lot for work   Recently ended a 5 year relationship.  Single   Parents are both deceased   No children   No pets   Has a 2 brother in [place]   Enjoys rowing, outdor activities (biking, golf)      Social Determinants of Corporate investment banker Strain: Not on file  Food Insecurity: Not on file  Transportation Needs: Not on file  Physical Activity: Not on file  Stress: Not on file  Social Connections: Not on file  Intimate Partner Violence: Not on file    Review of Systems: See HPI, otherwise negative ROS  Physical Exam: BP 123/83   Temp 97.7 F (36.5 C) (Temporal)   Ht 5' 4.02" (1.626 m)   Wt 58.4 kg   LMP 06/22/2014   SpO2 99%   BMI 22.10 kg/m  General:   Alert,  pleasant and cooperative in NAD Head:  Normocephalic and atraumatic. Lungs:  Clear to auscultation.    Heart:  Regular rate and rhythm.   Impression/Plan: Tammy Meyers is here for  ophthalmic surgery.  Risks, benefits, limitations, and alternatives regarding ophthalmic surgery have been reviewed with the patient.  Questions have been answered.  All parties agreeable.   Lockie Mola, MD  11/04/2022, 8:08 AM

## 2022-11-04 NOTE — Anesthesia Postprocedure Evaluation (Signed)
Anesthesia Post Note  Patient: Kenai Sharples  Procedure(s) Performed: CATARACT EXTRACTION PHACO AND INTRAOCULAR LENS PLACEMENT (IOC) RIGHT CLAREON VIVITY TORIC 5.81 00:43.9 (Right: Eye)  Patient location during evaluation: PACU Anesthesia Type: MAC Level of consciousness: awake and alert Pain management: pain level controlled Vital Signs Assessment: post-procedure vital signs reviewed and stable Respiratory status: spontaneous breathing, nonlabored ventilation, respiratory function stable and patient connected to nasal cannula oxygen Cardiovascular status: stable and blood pressure returned to baseline Postop Assessment: no apparent nausea or vomiting Anesthetic complications: no   No notable events documented.   Last Vitals:  Vitals:   11/04/22 0849 11/04/22 0853  BP: 111/76 112/86  Pulse: 63 63  Resp: 18   Temp: (!) 36.3 C (!) 36.4 C  SpO2: 95% 94%    Last Pain:  Vitals:   11/04/22 0853  TempSrc:   PainSc: 0-No pain                 Marisue Humble

## 2022-11-04 NOTE — Transfer of Care (Signed)
Immediate Anesthesia Transfer of Care Note  Patient: Tammy Meyers  Procedure(s) Performed: CATARACT EXTRACTION PHACO AND INTRAOCULAR LENS PLACEMENT (IOC) RIGHT CLAREON VIVITY TORIC 5.81 00:43.9 (Right: Eye)  Patient Location: PACU  Anesthesia Type: MAC  Level of Consciousness: awake, alert  and patient cooperative  Airway and Oxygen Therapy: Patient Spontanous Breathing and Patient connected to supplemental oxygen  Post-op Assessment: Post-op Vital signs reviewed, Patient's Cardiovascular Status Stable, Respiratory Function Stable, Patent Airway and No signs of Nausea or vomiting  Post-op Vital Signs: Reviewed and stable  Complications: No notable events documented.

## 2022-11-05 ENCOUNTER — Encounter: Payer: Self-pay | Admitting: Ophthalmology

## 2023-07-13 ENCOUNTER — Ambulatory Visit
Admission: RE | Admit: 2023-07-13 | Discharge: 2023-07-13 | Disposition: A | Discharge status: Home / Self Care | Payer: BC Managed Care – PPO | Source: Ambulatory Visit | Attending: Emergency Medicine | Admitting: Emergency Medicine

## 2023-07-13 VITALS — BP 136/88 | HR 76 | Temp 98.4°F | Resp 18

## 2023-07-13 DIAGNOSIS — J101 Influenza due to other identified influenza virus with other respiratory manifestations: Secondary | ICD-10-CM | POA: Diagnosis not present

## 2023-07-13 LAB — POC COVID19/FLU A&B COMBO
Covid Antigen, POC: NEGATIVE
Influenza A Antigen, POC: POSITIVE — AB
Influenza B Antigen, POC: NEGATIVE

## 2023-07-13 MED ORDER — OSELTAMIVIR PHOSPHATE 75 MG PO CAPS: 75.0000 mg | ORAL_CAPSULE | Freq: Two times a day (BID) | ORAL | 0 refills | Status: AC

## 2023-07-13 NOTE — ED Provider Notes (Signed)
[facility]my [facility]    CSN: [id number] Arrival date & time: 07/13/23  1503      History   Chief Complaint Chief Complaint  Patient presents with   Cough    fever, sleeplessness, chest tightness, aches - Entered by patient    HPI Tammy Meyers is a 63 y.o. female.  Patient presents with subjective fever, body aches, cough since yesterday.  Treating symptoms with Tylenol.  No shortness of breath, vomiting, diarrhea.  The history is provided by the patient and medical records.    Past Medical History:  Diagnosis Date   Anemia    hx of   Arthritis    knee   Chronic renal disease    Elevated cholesterol 2018   Hyperlipidemia    Osteoporosis    Urinary tract infection     Patient Active Problem List   Diagnosis Date Noted   Osteoporosis    Health care maintenance 03/09/2014   Postprandial hypoglycemia 03/09/2014   Uses oral contraception 03/09/2014    Past Surgical History:  Procedure Laterality Date   ABDOMINAL HERNIA REPAIR  2016   CATARACT EXTRACTION W/PHACO Left 10/14/2022   Procedure: CATARACT EXTRACTION PHACO AND INTRAOCULAR LENS PLACEMENT (IOC) LEFT Tammy Meyers;  Surgeon: Tammy Mola, MD;  [facility]: [facility];  Service: Ophthalmology;  Laterality: Left;  7.18 1:00.6   CATARACT EXTRACTION W/PHACO Right 11/04/2022   Procedure: CATARACT EXTRACTION PHACO AND INTRAOCULAR LENS PLACEMENT (IOC) RIGHT [facility];  Surgeon: Tammy Mola, MD;  [facility];  Service: Ophthalmology;  Laterality: Right;   INGUINAL HERNIA REPAIR  06/07/2012   Procedure: LAPAROSCOPIC INGUINAL HERNIA;  Surgeon: Atilano Ina, MD,FACS;  Location: [facility];  Service: General;  Laterality: Left;   INSERTION OF MESH  06/07/2012   Procedure: INSERTION OF MESH;  Surgeon: Atilano Ina, MD,FACS;  Location: [facility];  Service: General;  Laterality: Left;   KNEE SURGERY  1998   left. "total reconstruction"    OB History   No  obstetric history on file.      Home Medications    Prior to Admission medications   Medication Sig Start Date End Date Taking? Authorizing Provider  oseltamivir (TAMIFLU) 75 MG capsule Take 1 capsule (75 mg total) by mouth every 12 (twelve) hours. 07/13/23  Yes Tammy Bail, NP  Calcium Carbonate-Vit D-Min (CALCIUM 600+D PLUS MINERALS) 600-400 MG-UNIT TABS Take by mouth. 12/15/16   [provider]  ferrous sulfate 324 MG TBEC Take 324 mg by mouth daily with breakfast.    [provider]  Magnesium 500 MG TABS Take by mouth daily.    [provider]  Multiple Vitamin (MULTIVITAMIN) capsule Take 1 capsule by mouth daily.    [provider]  psyllium (METAMUCIL) 58.6 % powder Take 1 packet by mouth daily as needed.    [provider]  rosuvastatin (CRESTOR) 10 MG tablet Take 10 mg by mouth daily.    [provider]    Family History Family History  Problem Relation Age of Onset   Stroke Mother    Alcoholism Mother    Dementia Mother    Stroke Father        tia   Alcoholism Father    Dementia Father    Arthritis Father    Colon cancer Neg Hx    Esophageal cancer Neg Hx    Rectal cancer Neg Hx    Stomach cancer Neg Hx     Social History  Social History   Tobacco Use   Smoking status: Never   Smokeless tobacco: Never  Vaping Use   Vaping status: Never Used  Substance Use Topics   Alcohol use: Yes    Alcohol/week: 0.0 standard drinks of alcohol    Comment: 2 drinks each weekend   Drug use: No     Allergies   Patient has no known allergies.   Review of Systems Review of Systems  Constitutional:  Positive for fever. Negative for chills.  HENT:  Negative for ear pain and sore throat.   Respiratory:  Positive for cough. Negative for shortness of breath.   Gastrointestinal:  Negative for diarrhea and vomiting.     Physical Exam Triage Vital Signs ED Triage Vitals  Encounter Vitals Group     BP 07/13/23 1514  136/88     Systolic BP Percentile --      Diastolic BP Percentile --      Pulse Rate 07/13/23 1514 76     Resp 07/13/23 1514 18     Temp 07/13/23 1514 98.4 F (36.9 C)     Temp src --      SpO2 07/13/23 1514 98 %     Weight --      Height --      Head Circumference --      Peak Flow --      Pain Score 07/13/23 1508 4     Pain Loc --      Pain Education --      Exclude from Growth Chart --    No data found.  Updated Vital Signs BP 136/88   Pulse 76   Temp 98.4 F (36.9 C)   Resp 18   LMP 06/22/2014   SpO2 98%   Visual Acuity Right Eye Distance:   Left Eye Distance:   Bilateral Distance:    Right Eye Near:   Left Eye Near:    Bilateral Near:     Physical Exam Constitutional:      General: She is not in acute distress. HENT:     Right Ear: Tympanic membrane normal.     Left Ear: Tympanic membrane normal.     Nose: Nose normal.     Mouth/Throat:     Mouth: Mucous membranes are moist.     Pharynx: Oropharynx is clear.  Cardiovascular:     Rate and Rhythm: Normal rate and regular rhythm.     Heart sounds: Normal heart sounds.  Pulmonary:     Effort: Pulmonary effort is normal. No respiratory distress.     Breath sounds: Normal breath sounds.  Neurological:     Mental Status: She is alert.      UC Treatments / Results  Labs (all labs ordered are listed, but only abnormal results are displayed) Labs Reviewed  POC COVID19/FLU A&B COMBO    EKG   Radiology No results found.  Procedures Procedures (including critical care time)  Medications Ordered in UC Medications - No data to display  Initial Impression / Assessment and Plan / UC Course  I have reviewed the triage vital signs and the nursing notes.  Pertinent labs & imaging results that were available during my care of the patient were reviewed by me and considered in my medical decision making (see chart for details).   Influenza A.  Rapid flu test positive for influenza A.  COVID negative.   Treating with Tamiflu.  Discussed symptomatic treatment including Tylenol or ibuprofen as needed, rest, hydration.  Education provided on influenza.  Instructed patient to follow-up with PCP if symptoms are not improving.  Patient agrees to plan of care.    Final Clinical Impressions(s) / UC Diagnoses   Final diagnoses:  Influenza A     Discharge Instructions      Take the Tamiflu as directed.  Follow-up with your primary care provider if your symptoms are not improving.      ED Prescriptions     Medication Sig Dispense Auth. Provider   oseltamivir (TAMIFLU) 75 MG capsule Take 1 capsule (75 mg total) by mouth every 12 (twelve) hours. 10 capsule Tammy Bail, NP      PDMP not reviewed this encounter.   Tammy Bail, NP 07/13/23 [phone]

## 2023-07-13 NOTE — Discharge Instructions (Addendum)
Take the Tamiflu as directed.  Follow up with your primary care provider if your symptoms are not improving.    

## 2023-07-13 NOTE — ED Triage Notes (Signed)
Patient to Urgent Care with complaints of deep cough/ fevers/ body aches.  Symptoms x1 day. Wife sick w/ flu A.   Has been taking tylenol.

## 2023-08-06 ENCOUNTER — Ambulatory Visit (INDEPENDENT_AMBULATORY_CARE_PROVIDER_SITE_OTHER): Payer: Self-pay

## 2023-08-06 DIAGNOSIS — Z23 Encounter for immunization: Secondary | ICD-10-CM

## 2023-12-05 ENCOUNTER — Emergency Department

## 2023-12-05 ENCOUNTER — Other Ambulatory Visit: Payer: Self-pay

## 2023-12-05 ENCOUNTER — Emergency Department
Admission: EM | Admit: 2023-12-05 | Discharge: 2023-12-05 | Disposition: A | Discharge status: Home / Self Care | Attending: Emergency Medicine | Admitting: Emergency Medicine

## 2023-12-05 DIAGNOSIS — R002 Palpitations: Secondary | ICD-10-CM | POA: Diagnosis not present

## 2023-12-05 DIAGNOSIS — R0789 Other chest pain: Secondary | ICD-10-CM | POA: Diagnosis present

## 2023-12-05 LAB — CBC
HCT: 37.4 % (ref 36.0–46.0)
Hemoglobin: 12.1 g/dL (ref 12.0–15.0)
MCH: 29.2 pg (ref 26.0–34.0)
MCHC: 32.4 g/dL (ref 30.0–36.0)
MCV: 90.1 fL (ref 80.0–100.0)
Platelets: 219 10*3/uL (ref 150–400)
RBC: 4.15 MIL/uL (ref 3.87–5.11)
RDW: 13.8 % (ref 11.5–15.5)
WBC: 6.1 10*3/uL (ref 4.0–10.5)
nRBC: 0 % (ref 0.0–0.2)

## 2023-12-05 LAB — BASIC METABOLIC PANEL WITH GFR
Anion gap: 9 (ref 5–15)
BUN: 19 mg/dL (ref 8–23)
CO2: 24 mmol/L (ref 22–32)
Calcium: 9.1 mg/dL (ref 8.9–10.3)
Chloride: 108 mmol/L (ref 98–111)
Creatinine, Ser: 0.77 mg/dL (ref 0.44–1.00)
GFR, Estimated: >60 mL/min (ref >60)
Glucose, Bld: 87 mg/dL (ref 70–99)
Potassium: 3.8 mmol/L (ref 3.5–5.1)
Sodium: 141 mmol/L (ref 135–145)

## 2023-12-05 LAB — MAGNESIUM: Magnesium: 2.5 mg/dL — ABNORMAL HIGH (ref 1.7–2.4)

## 2023-12-05 LAB — TROPONIN I (HIGH SENSITIVITY): Troponin I (High Sensitivity): 3 ng/L (ref <18)

## 2023-12-05 LAB — D-DIMER, QUANTITATIVE: D-Dimer, Quant: 0.3 ug{FEU}/mL (ref 0.00–0.50)

## 2023-12-05 MED ORDER — KETOROLAC TROMETHAMINE 30 MG/ML IJ SOLN
15.0000 mg | Freq: Once | INTRAMUSCULAR | Status: AC
  Administered 2023-12-05: 15 mg via INTRAVENOUS
  Filled 2023-12-05: qty 1

## 2023-12-05 NOTE — Discharge Instructions (Signed)
 Use Tylenol  for pain and fevers.  Up to 1000 mg per dose, up to 4 times per day.  Do not take more than 4000 mg of Tylenol /acetaminophen  within 24 hours..  Use naproxen/Aleve for anti-inflammatory pain relief. Use up to 500mg  every 12 hours. Do not take more frequently than this. Do not use other NSAIDs (ibuprofen , Advil ) while taking this medication. It is safe to take Tylenol  with this.   You should get a phone call from the cardiologist's to schedule an appointment in the clinic, if your symptoms worsen in the meantime then please return to the ED

## 2023-12-05 NOTE — ED Notes (Signed)
 Patient transported to radiology

## 2023-12-05 NOTE — ED Notes (Signed)
 CCMD called to initiate cardiac monitoring

## 2023-12-05 NOTE — ED Provider Notes (Signed)
 [facility] Provider Note    Event Date/Time   First MD Initiated Contact with Patient 12/05/23 1812     (approximate)   History   No chief complaint on file.   HPI  Tammy Meyers is a 63 y.o. female who presents to the ED for evaluation of No chief complaint on file.   Review of routine PCP visit from March.  History of osteoporosis.  Generally healthy with minimal medical history.  Takes a daily statin, vitamins and supplements.  Patient presents to the ED alongside her spouse for evaluation of chest pressure and palpitations over the past week, after recent diarrheal illness.  Reports loose stools without abdominal pain, emesis or fevers that is starting to improve.  She is now reporting a consistent pressure in her chest, palpitations over the past few days.  No dizziness, syncope, abdominal pain, headache, fever, shortness of breath, cough   Physical Exam   Triage Vital Signs: ED Triage Vitals  Encounter Vitals Group     BP      Girls Systolic BP Percentile      Girls Diastolic BP Percentile      Boys Systolic BP Percentile      Boys Diastolic BP Percentile      Pulse      Resp      Temp      Temp src      SpO2      Weight      Height      Head Circumference      Peak Flow      Pain Score      Pain Loc      Pain Education      Exclude from Growth Chart     Most recent vital signs: There were no vitals filed for this visit.  General: Awake, no distress.  Well-appearing, pleasant and conversational CV:  Good peripheral perfusion.  RRR without appreciable murmur Resp:  Normal effort.  Clear Abd:  No distention.  Soft and benign throughout without any tenderness, guarding or peritoneal features MSK:  No deformity noted.  Reproducible left-sided chest pressure with palpation without overlying skin changes or signs of trauma. Neuro:  No focal deficits appreciated. Other:     ED Results / Procedures / Treatments   Labs (all  labs ordered are listed, but only abnormal results are displayed) Labs Reviewed - No data to display  EKG Sinus rhythm with a rate of 65 bpm.  Normal axis and intervals without clear signs of acute ischemia.  RADIOLOGY CXR interpreted by me without evidence of acute cardiopulmonary pathology.  Official radiology report(s): No results found.  PROCEDURES and INTERVENTIONS:  .1-3 Lead EKG Interpretation  Performed by: Arline Bennett, MD Authorized by: Arline Bennett, MD     Interpretation: normal     ECG rate:  66   ECG rate assessment: normal     Rhythm: sinus rhythm     Ectopy: none     Conduction: normal     Medications - No data to display   IMPRESSION / MDM / ASSESSMENT AND PLAN / ED COURSE  I reviewed the triage vital signs and the nursing notes.  Differential diagnosis includes, but is not limited to, ACS, PTX, PNA, muscle strain/spasm, PE, dissection, anxiety, pleural effusion  {Patient presents with symptoms of an acute illness or injury that is potentially life-threatening.  Generally healthy 63 year old presents with a week of atypical chest pressure, palpitations without evidence of  acute pathology and suitable for outpatient management with cardiology follow-up.  Looks well to me, some reproducible chest pressure on palpation.  EKG is nonischemic, troponin is low as well as her D-dimer.  She has essentially normal electrolytes, CBC and symptoms resolving with Toradol.  Possibly MSK etiology for the pressure.  No dysrhythmias noted on the monitor.  I considered admission for this patient but I believe she would be suitable for trial of outpatient management.  Referred to cardiology and discussed return precautions  Clinical Course as of 12/05/23 2112  Sun Dec 05, 2023  2040 Reassessed, feeling better after the Toradol.  We discussed reassuring workup overall and possible etiologies of symptoms.  Discussed cardiology referral and ED return precautions.  Answered questions.  [DS]    Clinical Course User Index [DS] Arline Bennett, MD     FINAL CLINICAL IMPRESSION(S) / ED DIAGNOSES   Final diagnoses:  None     Rx / DC Orders   ED Discharge Orders     None        Note:  This document was prepared using Dragon voice recognition software and may include unintentional dictation errors.   Arline Bennett, MD 12/05/23 2115

## 2023-12-05 NOTE — ED Triage Notes (Signed)
 Pt to ed from home via POV for CPO x 1 week with some intermittent dizziness as well. Pt is caox4, in no acute distress and ambulatory to room 6.
